# Patient Record
Sex: Female | Born: 1955 | Race: Black or African American | Hispanic: No | Marital: Single | Smoking: Former smoker
Health system: Southern US, Community
[De-identification: ages and names within clinical notes are randomized; demographics above are authoritative.]

## PROBLEM LIST (undated history)

## (undated) DIAGNOSIS — M359 Systemic involvement of connective tissue, unspecified: Secondary | ICD-10-CM

## (undated) DIAGNOSIS — M2550 Pain in unspecified joint: Secondary | ICD-10-CM

## (undated) DIAGNOSIS — D649 Anemia, unspecified: Secondary | ICD-10-CM

## (undated) HISTORY — DX: Anemia, unspecified: D64.9

## (undated) HISTORY — DX: Systemic involvement of connective tissue, unspecified: M35.9

## (undated) HISTORY — DX: Pain in unspecified joint: M25.50

## (undated) HISTORY — PX: COLONOSCOPY: SHX174

---

## 2014-09-22 ENCOUNTER — Ambulatory Visit: Payer: Self-pay | Admitting: Family Medicine

## 2015-11-05 ENCOUNTER — Inpatient Hospital Stay: Payer: BC Managed Care – PPO | Admitting: Oncology

## 2015-12-13 ENCOUNTER — Emergency Department
Admission: EM | Admit: 2015-12-13 | Discharge: 2015-12-13 | Disposition: A | Discharge status: Home / Self Care | Payer: BC Managed Care – PPO | Attending: Student | Admitting: Student

## 2015-12-13 ENCOUNTER — Encounter: Payer: Self-pay | Admitting: Emergency Medicine

## 2015-12-13 DIAGNOSIS — Z87891 Personal history of nicotine dependence: Secondary | ICD-10-CM | POA: Insufficient documentation

## 2015-12-13 DIAGNOSIS — M25562 Pain in left knee: Secondary | ICD-10-CM | POA: Insufficient documentation

## 2015-12-13 DIAGNOSIS — M791 Myalgia, unspecified site: Secondary | ICD-10-CM

## 2015-12-13 DIAGNOSIS — M25561 Pain in right knee: Secondary | ICD-10-CM | POA: Insufficient documentation

## 2015-12-13 DIAGNOSIS — M25551 Pain in right hip: Secondary | ICD-10-CM | POA: Insufficient documentation

## 2015-12-13 DIAGNOSIS — M25552 Pain in left hip: Secondary | ICD-10-CM | POA: Diagnosis not present

## 2015-12-13 DIAGNOSIS — M549 Dorsalgia, unspecified: Secondary | ICD-10-CM | POA: Diagnosis present

## 2015-12-13 DIAGNOSIS — G8929 Other chronic pain: Secondary | ICD-10-CM | POA: Insufficient documentation

## 2015-12-13 DIAGNOSIS — M25569 Pain in unspecified knee: Secondary | ICD-10-CM

## 2015-12-13 DIAGNOSIS — M25559 Pain in unspecified hip: Secondary | ICD-10-CM

## 2015-12-13 LAB — CBC WITH DIFFERENTIAL/PLATELET
BASOS ABS: 0 10*3/uL (ref 0–0.1)
BASOS PCT: 0 %
Eosinophils Absolute: 0.1 10*3/uL (ref 0–0.7)
Eosinophils Relative: 3 %
HEMATOCRIT: 30.9 % — AB (ref 35.0–47.0)
HEMOGLOBIN: 9.9 g/dL — AB (ref 12.0–16.0)
LYMPHS PCT: 19 %
Lymphs Abs: 0.8 10*3/uL — ABNORMAL LOW (ref 1.0–3.6)
MCH: 24.4 pg — ABNORMAL LOW (ref 26.0–34.0)
MCHC: 31.9 g/dL — ABNORMAL LOW (ref 32.0–36.0)
MCV: 76.3 fL — AB (ref 80.0–100.0)
Monocytes Absolute: 0.1 10*3/uL — ABNORMAL LOW (ref 0.2–0.9)
Monocytes Relative: 4 %
NEUTROS ABS: 2.9 10*3/uL (ref 1.4–6.5)
NEUTROS PCT: 74 %
Platelets: 181 10*3/uL (ref 150–440)
RBC: 4.05 MIL/uL (ref 3.80–5.20)
RDW: 16.2 % — ABNORMAL HIGH (ref 11.5–14.5)
WBC: 4 10*3/uL (ref 3.6–11.0)

## 2015-12-13 LAB — BASIC METABOLIC PANEL
ANION GAP: 7 (ref 5–15)
BUN: 6 mg/dL (ref 6–20)
CALCIUM: 9.1 mg/dL (ref 8.9–10.3)
CHLORIDE: 104 mmol/L (ref 101–111)
CO2: 26 mmol/L (ref 22–32)
Creatinine, Ser: 0.61 mg/dL (ref 0.44–1.00)
GFR calc non Af Amer: >60 mL/min (ref >60)
GLUCOSE: 98 mg/dL (ref 65–99)
POTASSIUM: 3.7 mmol/L (ref 3.5–5.1)
Sodium: 137 mmol/L (ref 135–145)

## 2015-12-13 LAB — CK: Total CK: 32 U/L — ABNORMAL LOW (ref 38–234)

## 2015-12-13 MED ORDER — SODIUM CHLORIDE 0.9 % IV BOLUS (SEPSIS)
500.0000 mL | Freq: Once | INTRAVENOUS | Status: AC
  Administered 2015-12-13: 500 mL via INTRAVENOUS

## 2015-12-13 MED ORDER — ONDANSETRON HCL 4 MG/2ML IJ SOLN
4.0000 mg | Freq: Once | INTRAMUSCULAR | Status: AC
  Administered 2015-12-13: 4 mg via INTRAVENOUS
  Filled 2015-12-13: qty 2

## 2015-12-13 MED ORDER — MORPHINE SULFATE (PF) 4 MG/ML IV SOLN
4.0000 mg | Freq: Once | INTRAVENOUS | Status: AC
  Administered 2015-12-13: 4 mg via INTRAVENOUS
  Filled 2015-12-13: qty 1

## 2015-12-13 MED ORDER — KETOROLAC TROMETHAMINE 30 MG/ML IJ SOLN
15.0000 mg | Freq: Once | INTRAMUSCULAR | Status: AC
  Administered 2015-12-13: 15 mg via INTRAVENOUS
  Filled 2015-12-13: qty 1

## 2015-12-13 MED ORDER — TRAMADOL HCL 50 MG PO TABS: 50.0000 mg | ORAL_TABLET | Freq: Three times a day (TID) | ORAL | Status: AC | PRN

## 2015-12-13 NOTE — ED Provider Notes (Signed)
[facility] Emergency Department Provider Note   ____________________________________________  Time seen: Approximately 7:04 PM  I have reviewed the triage vital signs and the nursing notes.   HISTORY  Chief Complaint Generalized Body Aches    HPI Morrison Masser is a 60 y.o. female with history of connective tissue disease, diffuse myalgias, chronic musculoskeletal pain since 2013 who presents for evaluation of severe arthralgias and burning myalgias over the past several months, worse over the past 4 days, gradual onset, constant since onset, worse since she started gabapentin 4 days ago. The patient reports that the pain in her arms, hands, wrists, legs, feet was so severe that she went to see her rheumatologist 4 days ago. They started her on gabapentin which instructions to increase the medication over time. She reports that since she started the medicine she has felt worse. No chest pain or difficulty breathing, no vomiting, diarrhea, fevers or chills. She is also complaining of new lumbar back pain today without trauma, no fevers, no history of IV drug use, no bowel or bladder incontinence, no numbness or weakness in the legs.   History reviewed. No pertinent past medical history.  There are no active problems to display for this patient.   History reviewed. No pertinent past surgical history.  No current outpatient prescriptions on file.  Allergies Review of patient's allergies indicates no known allergies.  History reviewed. No pertinent family history.  Social History Social History  Substance Use Topics  . Smoking status: Former Games developer  . Smokeless tobacco: None  . Alcohol Use: No    Review of Systems Constitutional: No fever/chills Eyes: No visual changes. ENT: No sore throat. Cardiovascular: Denies chest pain. Respiratory: Denies shortness of breath. Gastrointestinal: No abdominal pain.  No nausea, no vomiting.  No diarrhea.   No constipation. Genitourinary: Negative for dysuria. Musculoskeletal: Positive for back pain. Skin: Negative for rash. Neurological: Negative for headaches, focal weakness or numbness.  10-point ROS otherwise negative.  ____________________________________________   PHYSICAL EXAM:  VITAL SIGNS: ED Triage Vitals  Enc Vitals Group     BP 12/13/15 1838 122/81 mmHg     Pulse Rate 12/13/15 1838 97     Resp 12/13/15 1838 20     Temp 12/13/15 1838 98.2 F (36.8 C)     Temp Source 12/13/15 1838 Oral     SpO2 12/13/15 1838 100 %     Weight 12/13/15 1838 134 lb (60.782 kg)     Height 12/13/15 1838 5\' 4"  (1.626 m)     Head Cir --      Peak Flow --      Pain Score 12/13/15 1838 10     Pain Loc --      Pain Edu? --      Excl. in GC? --     Constitutional: Alert and oriented. Well appearing and in no acute distress. Eyes: Conjunctivae are normal. PERRL. EOMI. Head: Atraumatic. Nose: No congestion/rhinnorhea. Mouth/Throat: Mucous membranes are moist.  Oropharynx non-erythematous. Neck: No stridor. Supple without meningismus. Cardiovascular: Normal rate, regular rhythm. Grossly normal heart sounds.  Good peripheral circulation. Respiratory: Normal respiratory effort.  No retractions. Lungs CTAB. Gastrointestinal: Soft and nontender. No distention.  No CVA tenderness. Genitourinary: deferred Musculoskeletal: There is tenderness throughout the forearm, the upper arm, the wrists bilaterally, the feet without obvious swelling or deformity. Mild tenderness throughout the paraspinal muscles associated with the lumbar spine but no midline tenderness. She has pain with range of motion at all the  large and medium joints including the ankles, wrists, knees, hips, elbows. Neurologic:  Normal speech and language. No gross focal neurologic deficits are appreciated. Skin:  Skin is warm, dry and intact. No rash noted. Psychiatric: Mood and affect are normal. Speech and behavior are  normal.  ____________________________________________   LABS (all labs ordered are listed, but only abnormal results are displayed)  Labs Reviewed  CBC WITH DIFFERENTIAL/PLATELET - Abnormal; Notable for the following:    Hemoglobin 9.9 (*)    HCT 30.9 (*)    MCV 76.3 (*)    MCH 24.4 (*)    MCHC 31.9 (*)    RDW 16.2 (*)    Lymphs Abs 0.8 (*)    Monocytes Absolute 0.1 (*)    All other components within normal limits  CK - Abnormal; Notable for the following:    Total CK 32 (*)    All other components within normal limits  BASIC METABOLIC PANEL   ____________________________________________  EKG  none ____________________________________________  RADIOLOGY  none ____________________________________________   PROCEDURES  Procedure(s) performed: None  Critical Care performed: No  ____________________________________________   INITIAL IMPRESSION / ASSESSMENT AND PLAN / ED COURSE  Pertinent labs & imaging results that were available during my care of the patient were reviewed by me and considered in my medical decision making (see chart for details).  Annielee Jemmott is a 60 y.o. female with history of connective tissue disease, diffuse myalgias, chronic muscular skeletal pain since 2013 who presents for evaluation of severe arthralgias and myalgias over the past several months, worse over the past 4 days. On exam, she is generally well-appearing and in no acute distress. Her vital signs are stable, she is afebrile. She has tenderness to palpation throughout her entire body and pain with range of motion at all of her joints. She has nonspecific myalgias and arthralgias which have been ongoing for some time. Her CBC is notable for mild anemia, hemoglobin 9.9 however that is anemia is chronic and known. Unremarkable BMP and CK. We will attempt to treat her pain and reassess for disposition.  ----------------------------------------- 9:09 PM on  12/13/2015 ----------------------------------------- The patient received formula grams of IV morphine, as well as 15 mg of Toradol with significant improvement of her pain. She will she feels well, she is now able to ambulate without any assistance. Discussed with her that she needs to continue taking her gabapentin according to her rheumatologist's instructions and follow-up with her doctors as soon as possible. I discussed that I will send her home with a perscription for a total #6 Ultram tablets for help with breakthrough pain. We discussed return precautions, need for close follow-up and she is comfortable with the discharge plan. DC home. ____________________________________________   FINAL CLINICAL IMPRESSION(S) / ED DIAGNOSES  Final diagnoses:  Chronic arthralgias of knees and hips, unspecified laterality  Myalgia      NEW MEDICATIONS STARTED DURING THIS VISIT:  New Prescriptions   No medications on file     Note:  This document was prepared using Dragon voice recognition software and may include unintentional dictation errors.    Gayla Doss, MD 12/13/15 2111

## 2015-12-13 NOTE — ED Notes (Signed)
MD at bedside. 

## 2015-12-13 NOTE — ED Notes (Signed)
Pt presents with body aches x 2 months that have gotten "excruciating" this week. Pt states she is seeing a rheumatologist at [facility] and had an appt on Monday. Pt states that she was given gabapentin prescription on Monday but feels like her pain is worse since she started taking it. Pt has been in contact with provider this week, who told her to give meds time to work. She states she couldn't stand the pain so she came here. Pt alert & oriented with nad noted. She does show difficulty moving extremities due to pain.

## 2015-12-17 ENCOUNTER — Inpatient Hospital Stay: Payer: BC Managed Care – PPO | Attending: Oncology | Admitting: Oncology

## 2015-12-17 ENCOUNTER — Encounter: Payer: Self-pay | Admitting: Oncology

## 2015-12-17 ENCOUNTER — Inpatient Hospital Stay: Payer: BC Managed Care – PPO

## 2015-12-17 VITALS — BP 118/80 | HR 92 | Temp 98.4°F | Resp 14 | Wt 130.5 lb

## 2015-12-17 DIAGNOSIS — M329 Systemic lupus erythematosus, unspecified: Secondary | ICD-10-CM | POA: Insufficient documentation

## 2015-12-17 DIAGNOSIS — Z87891 Personal history of nicotine dependence: Secondary | ICD-10-CM | POA: Insufficient documentation

## 2015-12-17 DIAGNOSIS — M549 Dorsalgia, unspecified: Secondary | ICD-10-CM | POA: Insufficient documentation

## 2015-12-17 DIAGNOSIS — M2550 Pain in unspecified joint: Secondary | ICD-10-CM | POA: Diagnosis not present

## 2015-12-17 DIAGNOSIS — R5383 Other fatigue: Secondary | ICD-10-CM | POA: Diagnosis not present

## 2015-12-17 DIAGNOSIS — Z7952 Long term (current) use of systemic steroids: Secondary | ICD-10-CM | POA: Insufficient documentation

## 2015-12-17 DIAGNOSIS — Z79899 Other long term (current) drug therapy: Secondary | ICD-10-CM | POA: Insufficient documentation

## 2015-12-17 DIAGNOSIS — D649 Anemia, unspecified: Secondary | ICD-10-CM | POA: Diagnosis not present

## 2015-12-17 DIAGNOSIS — R531 Weakness: Secondary | ICD-10-CM | POA: Insufficient documentation

## 2015-12-17 DIAGNOSIS — M791 Myalgia: Secondary | ICD-10-CM | POA: Insufficient documentation

## 2015-12-17 LAB — LACTATE DEHYDROGENASE: LDH: 170 U/L (ref 98–192)

## 2015-12-17 LAB — FOLATE: FOLATE: 15.6 ng/mL (ref >5.9)

## 2015-12-17 LAB — DAT, POLYSPECIFIC AHG (ARMC ONLY): POLYSPECIFIC AHG TEST: NEGATIVE

## 2015-12-17 LAB — SEDIMENTATION RATE: SED RATE: 76 mm/h — AB (ref 0–30)

## 2015-12-17 NOTE — Progress Notes (Signed)
Patient is not sure why she has been referred to this office but she is hoping it is to help find a diagnosis and help with the pain.  The referral was made for abnormal labs.

## 2015-12-18 LAB — HEMOGLOBINOPATHY EVALUATION
HGB A2 QUANT: 2.2 % (ref 0.7–3.1)
HGB A: 97.8 % (ref 94.0–98.0)
Hgb C: 0 %
Hgb F Quant: 0 % (ref 0.0–2.0)
Hgb S Quant: 0 %

## 2015-12-18 LAB — HAPTOGLOBIN: Haptoglobin: 318 mg/dL — ABNORMAL HIGH (ref 34–200)

## 2015-12-18 LAB — ERYTHROPOIETIN: ERYTHROPOIETIN: 23.1 m[IU]/mL — AB (ref 2.6–18.5)

## 2015-12-19 LAB — VITAMIN B12: Vitamin B-12: 509 pg/mL (ref 180–914)

## 2015-12-21 ENCOUNTER — Telehealth: Payer: Self-pay | Admitting: Oncology

## 2015-12-21 NOTE — Telephone Encounter (Signed)
Left patient message regarding results.

## 2015-12-21 NOTE — Telephone Encounter (Signed)
Patient is in a lot of pain and wants you to please call her to discuss this with her. She said her pain is so bad she may go to ER for relief. She had labs drawn 12/17/15 and was expecting a call back to go over results. Please call: [phone].

## 2015-12-21 NOTE — Telephone Encounter (Signed)
Other than anemia, patient's labs are fine.  I do not have a reason for her pain.

## 2015-12-22 NOTE — Progress Notes (Signed)
[place]  [phone] [phone]  ID: Morgan Reid OB: 1955/09/28  MR#: [id number]  YCX#:[id number]  Patient Care Team: Pcp Not In System as PCP - General  CHIEF COMPLAINT:  Chief Complaint  Patient presents with  . New Evaluation    INTERVAL HISTORY: Patient is a 60 year old female patient with history of lupus and complaints of "pain all over" has been referred for further evaluation and persistent anemia. Currently, she is in discomfort secondary to pain all over inculding myalgias and bone pain. Patient states she is significantly weak and fatigued from constantly being in pain. She has a poor appetite. She denies any shortness of breath, cough, or hemoptysis. She denies any nausea, vomiting, constipation, or diarrhea. She has no melena or hematochezia. She has no urinary patient generally feels terrible, but offers no further specific complaints.complaints. She has no neurologic complaints.  REVIEW OF SYSTEMS:   Review of Systems  Constitutional: Positive for malaise/fatigue. Negative for fever and weight loss.  Respiratory: Negative.  Negative for cough and shortness of breath.   Cardiovascular: Negative.  Negative for chest pain.  Gastrointestinal: Negative.  Negative for blood in stool and melena.  Genitourinary: Negative.   Musculoskeletal: Positive for myalgias, back pain, joint pain and neck pain.  Neurological: Positive for weakness.  Psychiatric/Behavioral: Positive for depression.    As per HPI. Otherwise, a complete review of systems is negatve.  PAST MEDICAL HISTORY: Past Medical History  Diagnosis Date  . Connective tissue disease ([place])     a. S/p Methotrexate. b. Plaquenil. c. Prednisone.   . Anemia   . Arthralgia     PAST SURGICAL HISTORY: No past surgical history on file.  FAMILY HISTORY: Reviewed and unchanged. No reported history of malignancy or chronic disease.     ADVANCED DIRECTIVES:    HEALTH  MAINTENANCE: Social History  Substance Use Topics  . Smoking status: Former Research scientist (life sciences)  . Smokeless tobacco: Not on file  . Alcohol Use: No     Colonoscopy:  PAP:  Bone density:  Lipid panel:  No Known Allergies  Current Outpatient Prescriptions  Medication Sig Dispense Refill  . gabapentin (NEURONTIN) 300 MG capsule     . predniSONE (DELTASONE) 5 MG tablet     . traMADol (ULTRAM) 50 MG tablet Take 1 tablet (50 mg total) by mouth every 8 (eight) hours as needed for severe pain. Do not drive while taking this medication. 6 tablet 0   No current facility-administered medications for this visit.    OBJECTIVE: Filed Vitals:   12/17/15 1141  BP: 118/80  Pulse: 92  Temp: 98.4 F (36.9 C)  Resp: 14     Body mass index is 22.39 kg/(m^2).    ECOG FS:0 - Asymptomatic  General: Well-developed, well-nourished, no acute distress. Eyes: Pink conjunctiva, anicteric sclera. HEENT: Normocephalic, moist mucous membranes, clear oropharnyx. Lungs: Clear to auscultation bilaterally. Heart: Regular rate and rhythm. No rubs, murmurs, or gallops. Abdomen: Soft, nontender, nondistended. No organomegaly noted, normoactive bowel sounds. Musculoskeletal: No edema, cyanosis, or clubbing. Neuro: Alert, answering all questions appropriately. Cranial nerves grossly intact. Skin: No rashes or petechiae noted. Psych: Normal affect. Lymphatics: No cervical, calvicular, axillary or inguinal LAD.   LAB RESULTS:  Lab Results  Component Value Date   NA 137 12/13/2015   K 3.7 12/13/2015   CL 104 12/13/2015   CO2 26 12/13/2015   GLUCOSE 98 12/13/2015   BUN 6 12/13/2015   CREATININE 0.61 12/13/2015   CALCIUM 9.1 12/13/2015  GFRNONAA >60 12/13/2015   GFRAA >60 12/13/2015    Lab Results  Component Value Date   WBC 4.0 12/13/2015   NEUTROABS 2.9 12/13/2015   HGB 9.9* 12/13/2015   HCT 30.9* 12/13/2015   MCV 76.3* 12/13/2015   PLT 181 12/13/2015     STUDIES: No results found.  ASSESSMENT:  Anemia of chronic disease.  PLAN:    1. Anemia: Patient has a decreased hemoglobin, but all of her other laboratory work including iron stores, B12, folate, SPEP, hemoglobin electrophoresis, hemolysis labs are all negative or within normal limits. She has an appropriately elevated erythropoietin. Her sedimentation rate is significantly elevated secondary to inflammation which may be causing some mild bone marrow suppression. Her history of lupus may also be contributing. No intervention is needed at this time. Patient does not require bone marrow biopsy. Patient has been instructed to keep her clinic visit as scheduled with rheumatology for further evaluation and determination of the etiology of her pain and inflammation. Return to clinic in 2 weeks for further evaluation and discussion of her laboratory work.  Approximately 45 minutes was spent in discussion of which greater than 50% was consultation.  Patient expressed understanding and was in agreement with this plan. She also understands that She can call clinic at any time with any questions, concerns, or complaints.   No matching staging information was found for the patient.  Lloyd Huger, MD   12/22/2015 7:52 AM

## 2016-01-01 ENCOUNTER — Inpatient Hospital Stay: Payer: BC Managed Care – PPO | Admitting: Oncology

## 2017-06-09 ENCOUNTER — Other Ambulatory Visit: Payer: Self-pay | Admitting: Family Medicine

## 2017-06-09 DIAGNOSIS — R1030 Lower abdominal pain, unspecified: Secondary | ICD-10-CM

## 2017-06-09 DIAGNOSIS — R634 Abnormal weight loss: Secondary | ICD-10-CM

## 2017-06-17 ENCOUNTER — Ambulatory Visit
Admission: RE | Admit: 2017-06-17 | Discharge: 2017-06-17 | Disposition: A | Discharge status: Home / Self Care | Payer: BC Managed Care – PPO | Source: Ambulatory Visit | Attending: Family Medicine | Admitting: Family Medicine

## 2017-06-17 DIAGNOSIS — R1030 Lower abdominal pain, unspecified: Secondary | ICD-10-CM | POA: Insufficient documentation

## 2017-06-17 DIAGNOSIS — R634 Abnormal weight loss: Secondary | ICD-10-CM | POA: Diagnosis not present

## 2017-06-17 DIAGNOSIS — I82890 Acute embolism and thrombosis of other specified veins: Secondary | ICD-10-CM | POA: Insufficient documentation

## 2017-06-17 HISTORY — DX: Systemic involvement of connective tissue, unspecified: M35.9

## 2017-06-17 LAB — POCT I-STAT CREATININE: Creatinine, Ser: 0.7 mg/dL (ref 0.44–1.00)

## 2017-06-17 MED ORDER — IOPAMIDOL (ISOVUE-300) INJECTION 61%
85.0000 mL | Freq: Once | INTRAVENOUS | Status: AC | PRN
  Administered 2017-06-17: 85 mL via INTRAVENOUS

## 2018-02-02 ENCOUNTER — Emergency Department
Admission: EM | Admit: 2018-02-02 | Discharge: 2018-02-03 | Disposition: A | Discharge status: Home / Self Care | Payer: BC Managed Care – PPO | Attending: Student in an Organized Health Care Education/Training Program | Admitting: Student in an Organized Health Care Education/Training Program

## 2018-02-02 ENCOUNTER — Other Ambulatory Visit: Payer: Self-pay

## 2018-02-02 DIAGNOSIS — Z87891 Personal history of nicotine dependence: Secondary | ICD-10-CM | POA: Diagnosis not present

## 2018-02-02 DIAGNOSIS — Z79899 Other long term (current) drug therapy: Secondary | ICD-10-CM | POA: Insufficient documentation

## 2018-02-02 DIAGNOSIS — R109 Unspecified abdominal pain: Secondary | ICD-10-CM | POA: Diagnosis present

## 2018-02-02 DIAGNOSIS — R112 Nausea with vomiting, unspecified: Secondary | ICD-10-CM | POA: Insufficient documentation

## 2018-02-02 DIAGNOSIS — R935 Abnormal findings on diagnostic imaging of other abdominal regions, including retroperitoneum: Secondary | ICD-10-CM

## 2018-02-02 LAB — COMPREHENSIVE METABOLIC PANEL
ALBUMIN: 3.4 g/dL — AB (ref 3.5–5.0)
ALT: 7 U/L (ref 0–44)
ANION GAP: 12 (ref 5–15)
AST: 19 U/L (ref 15–41)
Alkaline Phosphatase: 95 U/L (ref 38–126)
BILIRUBIN TOTAL: 0.8 mg/dL (ref 0.3–1.2)
BUN: 8 mg/dL (ref 8–23)
CALCIUM: 9.3 mg/dL (ref 8.9–10.3)
CO2: 23 mmol/L (ref 22–32)
Chloride: 104 mmol/L (ref 98–111)
Creatinine, Ser: 0.64 mg/dL (ref 0.44–1.00)
GFR calc Af Amer: >60 mL/min (ref >60)
GFR calc non Af Amer: >60 mL/min (ref >60)
GLUCOSE: 120 mg/dL — AB (ref 70–99)
Potassium: 3.1 mmol/L — ABNORMAL LOW (ref 3.5–5.1)
SODIUM: 139 mmol/L (ref 135–145)
Total Protein: 8.6 g/dL — ABNORMAL HIGH (ref 6.5–8.1)

## 2018-02-02 LAB — CBC
HEMATOCRIT: 35.1 % (ref 35.0–47.0)
HEMOGLOBIN: 11.4 g/dL — AB (ref 12.0–16.0)
MCH: 24 pg — AB (ref 26.0–34.0)
MCHC: 32.4 g/dL (ref 32.0–36.0)
MCV: 74 fL — AB (ref 80.0–100.0)
Platelets: 318 10*3/uL (ref 150–440)
RBC: 4.75 MIL/uL (ref 3.80–5.20)
RDW: 14.1 % (ref 11.5–14.5)
WBC: 7.5 10*3/uL (ref 3.6–11.0)

## 2018-02-02 LAB — URINALYSIS, COMPLETE (UACMP) WITH MICROSCOPIC
BACTERIA UA: NONE SEEN
GLUCOSE, UA: NEGATIVE mg/dL
Hgb urine dipstick: NEGATIVE
KETONES UR: 20 mg/dL — AB
Leukocytes, UA: NEGATIVE
Nitrite: NEGATIVE
PROTEIN: NEGATIVE mg/dL
Specific Gravity, Urine: 1.012 (ref 1.005–1.030)
pH: 5 (ref 5.0–8.0)

## 2018-02-02 LAB — LIPASE, BLOOD: Lipase: 21 U/L (ref 11–51)

## 2018-02-02 MED ORDER — SODIUM CHLORIDE 0.9 % IV BOLUS
1000.0000 mL | Freq: Once | INTRAVENOUS | Status: AC
  Administered 2018-02-03: 1000 mL via INTRAVENOUS

## 2018-02-02 MED ORDER — MORPHINE SULFATE (PF) 4 MG/ML IV SOLN
4.0000 mg | INTRAVENOUS | Status: DC | PRN
  Administered 2018-02-03: 4 mg via INTRAVENOUS
  Filled 2018-02-02: qty 1

## 2018-02-02 MED ORDER — PROMETHAZINE HCL 25 MG/ML IJ SOLN
12.5000 mg | Freq: Four times a day (QID) | INTRAMUSCULAR | Status: DC | PRN
Start: 2018-02-02 — End: 2018-02-03
  Administered 2018-02-03: 12.5 mg via INTRAVENOUS
  Filled 2018-02-02 (×2): qty 1

## 2018-02-02 NOTE — ED Provider Notes (Addendum)
[facility] Emergency Department Provider Note    First MD Initiated Contact with Patient 02/02/18 2342     (approximate)  I have reviewed the triage vital signs and the nursing notes.   HISTORY  Chief Complaint Abdominal Pain and Emesis    HPI Elysium Bejar is a 62 y.o. female presents to the ER with chief complaint of left lower quadrant abdominal pain associate with nausea vomiting.  Patient has been taking GoLYTELY for bowel prep for colonoscopy scheduled for tomorrow morning but due to the nausea vomiting has not been able to keep any medications down.  States that she is having worsening abdominal pain and left lower quadrant.  No fevers.  States that she was supposed to have a colonoscopy after having CT imaging previously that was concerning for diverticulitis versus mass but was unable to complete that due to an adequate bowel prep.  Have been scheduled for outpatient colonoscopy tomorrow.  Denies any fevers.  No chest pain or shortness of breath.    Past Medical History:  Diagnosis Date  . Anemia   . Arthralgia   . Collagen vascular disease ([facility])    Rheumatoid Arthritis  . Connective tissue disease ([facility])    a. S/p Methotrexate. b. Plaquenil. c. Prednisone.    No family history on file. History reviewed. No pertinent surgical history. There are no active problems to display for this patient.     Prior to Admission medications   Medication Sig Start Date End Date Taking? Authorizing Provider  gabapentin (NEURONTIN) 300 MG capsule  12/10/15   [provider]  predniSONE (DELTASONE) 5 MG tablet  09/27/15   [provider]  promethazine (PHENERGAN) 12.5 MG tablet Take 1 tablet (12.5 mg total) by mouth every 6 (six) hours as needed for nausea or vomiting. 02/03/18   Willy Eddy, MD  traMADol (ULTRAM) 50 MG tablet Take 1 tablet (50 mg total) by mouth every 6 (six) hours as needed. 02/03/18 02/03/19  Willy Eddy, MD     Allergies Patient has no known allergies.    Social History Social History   Tobacco Use  . Smoking status: Former Games developer  . Smokeless tobacco: Never Used  Substance Use Topics  . Alcohol use: No  . Drug use: Not on file    Review of Systems Patient denies headaches, rhinorrhea, blurry vision, numbness, shortness of breath, chest pain, edema, cough, abdominal pain, nausea, vomiting, diarrhea, dysuria, fevers, rashes or hallucinations unless otherwise stated above in HPI. ____________________________________________   PHYSICAL EXAM:  VITAL SIGNS: Vitals:   02/03/18 0530 02/03/18 0600  BP: 116/68 119/78  Pulse: 84 85  Resp:    Temp:    SpO2: 98% 99%    Constitutional: Alert and oriented.  Eyes: Conjunctivae are normal.  Head: Atraumatic. Nose: No congestion/rhinnorhea. Mouth/Throat: Mucous membranes are moist.   Neck: No stridor. Painless ROM.  Cardiovascular: Normal rate, regular rhythm. Grossly normal heart sounds.  Good peripheral circulation. Respiratory: Normal respiratory effort.  No retractions. Lungs CTAB. Gastrointestinal: Soft with mild ttp in LLQ. No distention. No abdominal bruits. No CVA tenderness. Genitourinary:  Musculoskeletal: No lower extremity tenderness nor edema.  No joint effusions. Neurologic:  Normal speech and language. No gross focal neurologic deficits are appreciated. No facial droop Skin:  Skin is warm, dry and intact. No rash noted. Psychiatric: Mood and affect are normal. Speech and behavior are normal.  ____________________________________________   LABS (all labs ordered are listed, but only abnormal results are displayed)  Results for orders placed or performed during the hospital encounter of 02/02/18 (from the past 24 hour(s))  Lipase, blood     Status: None   Collection Time: 02/02/18  9:10 PM  Result Value Ref Range   Lipase 21 11 - 51 U/L  Comprehensive metabolic panel     Status: Abnormal   Collection Time:  02/02/18  9:10 PM  Result Value Ref Range   Sodium 139 135 - 145 mmol/L   Potassium 3.1 (L) 3.5 - 5.1 mmol/L   Chloride 104 98 - 111 mmol/L   CO2 23 22 - 32 mmol/L   Glucose, Bld 120 (H) 70 - 99 mg/dL   BUN 8 8 - 23 mg/dL   Creatinine, Ser 1.70 0.44 - 1.00 mg/dL   Calcium 9.3 8.9 - 01.7 mg/dL   Total Protein 8.6 (H) 6.5 - 8.1 g/dL   Albumin 3.4 (L) 3.5 - 5.0 g/dL   AST 19 15 - 41 U/L   ALT 7 0 - 44 U/L   Alkaline Phosphatase 95 38 - 126 U/L   Total Bilirubin 0.8 0.3 - 1.2 mg/dL   GFR calc non Af Amer >60 >60 mL/min   GFR calc Af Amer >60 >60 mL/min   Anion gap 12 5 - 15  CBC     Status: Abnormal   Collection Time: 02/02/18  9:10 PM  Result Value Ref Range   WBC 7.5 3.6 - 11.0 K/uL   RBC 4.75 3.80 - 5.20 MIL/uL   Hemoglobin 11.4 (L) 12.0 - 16.0 g/dL   HCT 49.4 49.6 - 75.9 %   MCV 74.0 (L) 80.0 - 100.0 fL   MCH 24.0 (L) 26.0 - 34.0 pg   MCHC 32.4 32.0 - 36.0 g/dL   RDW 16.3 84.6 - 65.9 %   Platelets 318 150 - 440 K/uL  Urinalysis, Complete w Microscopic     Status: Abnormal   Collection Time: 02/02/18  9:10 PM  Result Value Ref Range   Color, Urine AMBER (A) YELLOW   APPearance CLEAR (A) CLEAR   Specific Gravity, Urine 1.012 1.005 - 1.030   pH 5.0 5.0 - 8.0   Glucose, UA NEGATIVE NEGATIVE mg/dL   Hgb urine dipstick NEGATIVE NEGATIVE   Bilirubin Urine SMALL (A) NEGATIVE   Ketones, ur 20 (A) NEGATIVE mg/dL   Protein, ur NEGATIVE NEGATIVE mg/dL   Nitrite NEGATIVE NEGATIVE   Leukocytes, UA NEGATIVE NEGATIVE   RBC / HPF 6-10 0 - 5 RBC/hpf   WBC, UA 0-5 0 - 5 WBC/hpf   Bacteria, UA NONE SEEN NONE SEEN   Squamous Epithelial / LPF 6-10 0 - 5   Mucus PRESENT    ____________________________________________ ____________________________________________  RADIOLOGY  I personally reviewed all radiographic images ordered to evaluate for the above acute complaints and reviewed radiology reports and findings.  These findings were personally discussed with the patient.  Please see  medical record for radiology report.  ____________________________________________   PROCEDURES  Procedure(s) performed:  Procedures    Critical Care performed: no ____________________________________________   INITIAL IMPRESSION / ASSESSMENT AND PLAN / ED COURSE  Pertinent labs & imaging results that were available during my care of the patient were reviewed by me and considered in my medical decision making (see chart for details).   DDX: sbo, enteritis, diverticulitis, medication reaction, dehydration  Zhania Shaheen is a 62 y.o. who presents to the ED with symptoms as described above.  Patient afebrile hemodynamically stable.  Nontoxic-appearing but does have some discomfort  in her abdomen.  Do suspect some component of intolerance of the bowel prep.  Will initially trial somatic management.  Order KUB to evaluate for obstructive pattern.  Clinical Course as of Feb 04 647  Wed Feb 03, 2018  2778 Patient reassessed.  Resting comfortably.  Repeat abdominal exam is benign.  Seems less consistent with infectious process given lack of tachycardia leukocytosis or fever.  Probable and issues with not tolerating bowel prep.  KUB shows no evidence of obstructive pattern.  Will trial p.o. challenge and reassess.   [PR]  0217 With persistent discomfort.  States that she is not passing any gas therefore may have some worsening obstructive pathology that may be preventing her from tolerating appropriate bowel prep and further evaluate will order CT imaging.   [PR]  0438 CT imaging results discussed with patient and family.  Patient still having some discomfort.  At this point there is no evidence of complete obstruction.  Plan will be to touch base with GI earlier this morning as she is scheduled for early morning colonoscopy.  We will keep patient on IV fluids.   [PR]  0630 Patient reassessed.  No nausea or vomiting.  May have had some intolerance or inability to tolerate a bowel prep  causing primary nausea and vomiting.  Spoke with Dr. Marva Panda who recommends patient come to clinic today.  Her pain is under control at this time she does not have any additional vomiting.  Have discussed with the patient and available family all diagnostics and treatments performed thus far and all questions were answered to the best of my ability. The patient demonstrates understanding and agreement with plan.    [PR]    Clinical Course User Index [PR] Willy Eddy, MD     As part of my medical decision making, I reviewed the following data within the electronic MEDICAL RECORD NUMBER Nursing notes reviewed and incorporated, Labs reviewed, notes from prior ED visits.  ____________________________________________   FINAL CLINICAL IMPRESSION(S) / ED DIAGNOSES  Final diagnoses:  Non-intractable vomiting with nausea, unspecified vomiting type  Abnormal CT of the abdomen      NEW MEDICATIONS STARTED DURING THIS VISIT:  New Prescriptions   PROMETHAZINE (PHENERGAN) 12.5 MG TABLET    Take 1 tablet (12.5 mg total) by mouth every 6 (six) hours as needed for nausea or vomiting.   TRAMADOL (ULTRAM) 50 MG TABLET    Take 1 tablet (50 mg total) by mouth every 6 (six) hours as needed.     Note:  This document was prepared using Dragon voice recognition software and may include unintentional dictation errors.    Willy Eddy, MD 02/03/18 2423    Willy Eddy, MD 02/03/18 [phone]

## 2018-02-02 NOTE — ED Triage Notes (Addendum)
Pt arrives to ED via POV from home with c/o abdominal pain and emesis x "months". Pt has a coloscopy scheduled for tomorrow and states the bowel prep might be responsible for her s/x's. Pt reports LLQ abdominal pain, states she can "feel it when I walk". Pt denies CP or SHOB. Pt is A&O, in NAD; RR even, regular, and unlabored.

## 2018-02-03 ENCOUNTER — Emergency Department: Payer: BC Managed Care – PPO

## 2018-02-03 MED ORDER — POLYETHYLENE GLYCOL 3350 17 G PO PACK
17.0000 g | PACK | Freq: Every day | ORAL | Status: DC
  Administered 2018-02-03: 17 g via ORAL

## 2018-02-03 MED ORDER — POLYETHYLENE GLYCOL 3350 17 G PO PACK
PACK | ORAL | Status: AC
  Administered 2018-02-03: 17 g via ORAL
  Filled 2018-02-03: qty 1

## 2018-02-03 MED ORDER — TRAMADOL HCL 50 MG PO TABS: 50.0000 mg | ORAL_TABLET | Freq: Four times a day (QID) | ORAL | 0 refills | Status: AC | PRN

## 2018-02-03 MED ORDER — FENTANYL CITRATE (PF) 100 MCG/2ML IJ SOLN
100.0000 ug | INTRAMUSCULAR | Status: DC | PRN
  Administered 2018-02-03 (×2): 100 ug via INTRAVENOUS
  Filled 2018-02-03 (×2): qty 2

## 2018-02-03 MED ORDER — PROMETHAZINE HCL 12.5 MG PO TABS: 12.5000 mg | ORAL_TABLET | Freq: Four times a day (QID) | ORAL | 0 refills | Status: AC | PRN

## 2018-02-03 MED ORDER — SODIUM CHLORIDE 0.9 % IV BOLUS
1000.0000 mL | Freq: Once | INTRAVENOUS | Status: AC
  Administered 2018-02-03: 1000 mL via INTRAVENOUS

## 2018-02-03 MED ORDER — KETOROLAC TROMETHAMINE 30 MG/ML IJ SOLN
15.0000 mg | Freq: Once | INTRAMUSCULAR | Status: AC
  Administered 2018-02-03: 15 mg via INTRAVENOUS
  Filled 2018-02-03: qty 1

## 2018-02-03 NOTE — ED Notes (Signed)
Pt up to bedside toilet. Pt states she feels like she needs to have a bowel movement. Assisted with ambulation.

## 2018-02-03 NOTE — ED Notes (Signed)
Pt to CT. States she is feeling less nauseated since receiving pain medication.

## 2018-02-03 NOTE — ED Notes (Signed)
Pt unable to produce a bowel movement. Voided without difficulty.

## 2018-02-03 NOTE — Discharge Instructions (Addendum)
Do not go to colonoscopy today but instead go to Dr. Reyes Ivan clinic this afternoon.  I will prescribe you some pain medication as well as nausea medication.  You have been seen in the emergency department for emergency care. It is important that you contact your own doctor, specialist or the closest clinic for follow-up care. Please bring this instruction sheet, all medications and X-ray copies with you when you are seen for follow-up care.  Determining the exact cause for all patients with abdominal pain is extremely difficult in the emergency department. Our primary focus is to rule-out immediate life-threatening diseases. If no immediate source of pain is found the definitive diagnosis frequently needs to be determined over time.Many times your primary care physician can determine the cause by following the symptoms over time. Sometimes, specialist are required such as Gastroenterologists, Gynecologists, Urologists or Surgeons. Please return immediately to the Emergency Department for fever>101, Vomiting or Intractable Pain. You should return to the emergency department or see your primary care provider in 12-24hrs if your pain is no better and sooner if your pain becomes worse.

## 2018-03-25 ENCOUNTER — Ambulatory Visit: Payer: BC Managed Care – PPO | Admitting: Anesthesiology

## 2018-03-25 ENCOUNTER — Ambulatory Visit: Payer: BC Managed Care – PPO

## 2018-03-25 ENCOUNTER — Inpatient Hospital Stay
Admission: AD | Admit: 2018-03-25 | Discharge: 2018-04-14 | DRG: 329 | Disposition: A | Discharge status: Home / Self Care | Payer: BC Managed Care – PPO | Attending: General Surgery | Admitting: General Surgery

## 2018-03-25 ENCOUNTER — Other Ambulatory Visit: Payer: Self-pay

## 2018-03-25 ENCOUNTER — Encounter
Admission: AD | Disposition: A | Discharge status: Home / Self Care | Payer: Self-pay | Source: Home / Self Care | Attending: General Surgery

## 2018-03-25 ENCOUNTER — Encounter: Payer: Self-pay | Admitting: *Deleted

## 2018-03-25 DIAGNOSIS — Z681 Body mass index (BMI) 19 or less, adult: Secondary | ICD-10-CM

## 2018-03-25 DIAGNOSIS — K449 Diaphragmatic hernia without obstruction or gangrene: Secondary | ICD-10-CM | POA: Diagnosis present

## 2018-03-25 DIAGNOSIS — K2960 Other gastritis without bleeding: Secondary | ICD-10-CM | POA: Diagnosis present

## 2018-03-25 DIAGNOSIS — M069 Rheumatoid arthritis, unspecified: Secondary | ICD-10-CM | POA: Diagnosis present

## 2018-03-25 DIAGNOSIS — Z87891 Personal history of nicotine dependence: Secondary | ICD-10-CM

## 2018-03-25 DIAGNOSIS — K6389 Other specified diseases of intestine: Secondary | ICD-10-CM | POA: Diagnosis present

## 2018-03-25 DIAGNOSIS — D509 Iron deficiency anemia, unspecified: Secondary | ICD-10-CM | POA: Diagnosis present

## 2018-03-25 DIAGNOSIS — L0390 Cellulitis, unspecified: Secondary | ICD-10-CM | POA: Diagnosis present

## 2018-03-25 DIAGNOSIS — R109 Unspecified abdominal pain: Secondary | ICD-10-CM

## 2018-03-25 DIAGNOSIS — K2210 Ulcer of esophagus without bleeding: Secondary | ICD-10-CM | POA: Diagnosis present

## 2018-03-25 DIAGNOSIS — D638 Anemia in other chronic diseases classified elsewhere: Secondary | ICD-10-CM | POA: Diagnosis present

## 2018-03-25 DIAGNOSIS — E43 Unspecified severe protein-calorie malnutrition: Secondary | ICD-10-CM

## 2018-03-25 DIAGNOSIS — K5669 Other partial intestinal obstruction: Principal | ICD-10-CM | POA: Diagnosis present

## 2018-03-25 DIAGNOSIS — E876 Hypokalemia: Secondary | ICD-10-CM | POA: Diagnosis present

## 2018-03-25 DIAGNOSIS — G8929 Other chronic pain: Secondary | ICD-10-CM | POA: Diagnosis present

## 2018-03-25 DIAGNOSIS — Z7952 Long term (current) use of systemic steroids: Secondary | ICD-10-CM

## 2018-03-25 HISTORY — PX: COLONOSCOPY WITH PROPOFOL: SHX5780

## 2018-03-25 HISTORY — PX: ESOPHAGOGASTRODUODENOSCOPY (EGD) WITH PROPOFOL: SHX5813

## 2018-03-25 LAB — BASIC METABOLIC PANEL
ANION GAP: 8 (ref 5–15)
BUN: 9 mg/dL (ref 8–23)
CALCIUM: 8.4 mg/dL — AB (ref 8.9–10.3)
CO2: 26 mmol/L (ref 22–32)
Chloride: 106 mmol/L (ref 98–111)
Creatinine, Ser: 0.5 mg/dL (ref 0.44–1.00)
Glucose, Bld: 67 mg/dL — ABNORMAL LOW (ref 70–99)
POTASSIUM: 3.3 mmol/L — AB (ref 3.5–5.1)
Sodium: 140 mmol/L (ref 135–145)

## 2018-03-25 LAB — CBC
HCT: 27.8 % — ABNORMAL LOW (ref 35.0–47.0)
Hemoglobin: 9 g/dL — ABNORMAL LOW (ref 12.0–16.0)
MCH: 24.2 pg — ABNORMAL LOW (ref 26.0–34.0)
MCHC: 32.3 g/dL (ref 32.0–36.0)
MCV: 75 fL — AB (ref 80.0–100.0)
PLATELETS: 236 10*3/uL (ref 150–440)
RBC: 3.7 MIL/uL — ABNORMAL LOW (ref 3.80–5.20)
RDW: 16.4 % — AB (ref 11.5–14.5)
WBC: 3.7 10*3/uL (ref 3.6–11.0)

## 2018-03-25 SURGERY — COLONOSCOPY WITH PROPOFOL
Anesthesia: General

## 2018-03-25 MED ORDER — KETOROLAC TROMETHAMINE 15 MG/ML IJ SOLN
15.0000 mg | Freq: Four times a day (QID) | INTRAMUSCULAR | Status: DC | PRN
  Administered 2018-03-25 – 2018-03-26 (×3): 15 mg via INTRAVENOUS
  Filled 2018-03-25 (×4): qty 1

## 2018-03-25 MED ORDER — MIDAZOLAM HCL 2 MG/2ML IJ SOLN
INTRAMUSCULAR | Status: AC
  Filled 2018-03-25: qty 2

## 2018-03-25 MED ORDER — LIDOCAINE HCL (PF) 2 % IJ SOLN
INTRAMUSCULAR | Status: AC
  Filled 2018-03-25: qty 10

## 2018-03-25 MED ORDER — ONDANSETRON HCL 4 MG PO TABS: 4.0000 mg | ORAL_TABLET | Freq: Four times a day (QID) | ORAL | Status: DC | PRN

## 2018-03-25 MED ORDER — HYDROXYCHLOROQUINE SULFATE 200 MG PO TABS
200.0000 mg | ORAL_TABLET | ORAL | Status: DC
  Administered 2018-03-25: 200 mg via ORAL
  Filled 2018-03-25 (×11): qty 1

## 2018-03-25 MED ORDER — PROPOFOL 500 MG/50ML IV EMUL
INTRAVENOUS | Status: AC
  Filled 2018-03-25: qty 50

## 2018-03-25 MED ORDER — PROPOFOL 500 MG/50ML IV EMUL
INTRAVENOUS | Status: DC | PRN
  Administered 2018-03-25: 130 ug/kg/min via INTRAVENOUS

## 2018-03-25 MED ORDER — SODIUM CHLORIDE 0.9 % IV SOLN
INTRAVENOUS | Status: DC
  Administered 2018-03-25: 11:00:00 via INTRAVENOUS

## 2018-03-25 MED ORDER — PEG 3350-KCL-NA BICARB-NACL 420 G PO SOLR
4000.0000 mL | ORAL | Status: DC
  Administered 2018-03-25 – 2018-03-26 (×2): 4000 mL via ORAL
  Filled 2018-03-25: qty 4000

## 2018-03-25 MED ORDER — PHENYLEPHRINE HCL 10 MG/ML IJ SOLN
INTRAMUSCULAR | Status: DC | PRN
  Administered 2018-03-25: 50 ug via INTRAVENOUS

## 2018-03-25 MED ORDER — ONDANSETRON HCL 4 MG/2ML IJ SOLN
4.0000 mg | Freq: Four times a day (QID) | INTRAMUSCULAR | Status: DC | PRN
  Administered 2018-03-25 – 2018-03-26 (×2): 4 mg via INTRAVENOUS
  Filled 2018-03-25 (×2): qty 2

## 2018-03-25 MED ORDER — ACETAMINOPHEN 650 MG RE SUPP
650.0000 mg | Freq: Four times a day (QID) | RECTAL | Status: DC | PRN
  Filled 2018-03-25: qty 1

## 2018-03-25 MED ORDER — LIDOCAINE HCL (CARDIAC) PF 100 MG/5ML IV SOSY
PREFILLED_SYRINGE | INTRAVENOUS | Status: DC | PRN
  Administered 2018-03-25: 50 mg via INTRAVENOUS

## 2018-03-25 MED ORDER — ACETAMINOPHEN 325 MG PO TABS
650.0000 mg | ORAL_TABLET | Freq: Four times a day (QID) | ORAL | Status: DC | PRN
  Administered 2018-03-26 – 2018-04-08 (×3): 650 mg via ORAL
  Filled 2018-03-25 (×3): qty 2

## 2018-03-25 MED ORDER — PROPOFOL 10 MG/ML IV BOLUS
INTRAVENOUS | Status: DC | PRN
  Administered 2018-03-25: 20 mg via INTRAVENOUS
  Administered 2018-03-25: 50 mg via INTRAVENOUS
  Administered 2018-03-25: 20 mg via INTRAVENOUS

## 2018-03-25 MED ORDER — METHYLPREDNISOLONE 4 MG PO TABS
4.0000 mg | ORAL_TABLET | Freq: Every day | ORAL | Status: DC
  Administered 2018-04-01 – 2018-04-04 (×4): 4 mg via ORAL
  Filled 2018-03-25 (×13): qty 1

## 2018-03-25 NOTE — Op Note (Signed)
[facility] Patient Name: Morgan Reid Procedure Date: 03/25/2018 12:57 PM MRN: [id number] Account #: [id number] Date of Birth: May 02, 1956 Admit Type: Outpatient Age: 62 Room: [facility] ENDO ROOM 2 Gender: Female Note Status: Finalized Procedure:            Upper GI endoscopy Indications:          Epigastric abdominal pain, Nausea with vomiting Providers:            [name], MD Referring MD:         No Local Md, MD (Referring MD) Medicines:            Monitored Anesthesia Care Complications:        No immediate complications. Procedure:            Pre-Anesthesia Assessment:                       - ASA Grade Assessment: III - A patient with severe                        systemic disease.                       After obtaining informed consent, the endoscope was                        passed under direct vision. Throughout the procedure,                        the patient's blood pressure, pulse, and oxygen                        saturations were monitored continuously. The Endoscope                        was introduced through the mouth, and advanced to the                        third part of duodenum. The upper GI endoscopy was                        accomplished without difficulty. The patient tolerated                        the procedure well. Findings:      A small hiatal hernia was present.      Patchy mild inflammation characterized by adherent blood, congestion       (edema) and erosions was found in the gastric body. Biopsies were taken       with a cold forceps for histology. Biopsies were taken with a cold       forceps for Helicobacter pylori testing.      The cardia and gastric fundus were normal on retroflexion otherwise.      The examined duodenum was normal. Impression:           - Small hiatal hernia.                       - Bile erosive gastritis. Biopsied.                       - Normal examined duodenum.  Recommendation:        - Use Protonix (pantoprazole) 40 mg PO daily daily.                       - Await pathology results. Procedure Code(s):    --- Professional ---                       [id number], Esophagogastroduodenoscopy, flexible, transoral;                        with biopsy, single or multiple Diagnosis Code(s):    --- Professional ---                       K44.9, Diaphragmatic hernia without obstruction or                        gangrene                       K29.60, Other gastritis without bleeding                       R10.13, Epigastric pain                       R11.2, Nausea with vomiting, unspecified CPT copyright 2017 American Medical Association. All rights reserved. The codes documented in this report are preliminary and upon coder review may  be revised to meet current compliance requirements. [name], MD 03/25/2018 1:17:06 PM This report has been signed electronically. Number of Addenda: 0 Note Initiated On: 03/25/2018 12:57 PM      [facility]

## 2018-03-25 NOTE — Transfer of Care (Signed)
Immediate Anesthesia Transfer of Care Note  Patient: Morgan Reid  Procedure(s) Performed: COLONOSCOPY WITH PROPOFOL (N/A ) ESOPHAGOGASTRODUODENOSCOPY (EGD) WITH PROPOFOL (N/A )  Patient Location: PACU  Anesthesia Type:General  Level of Consciousness: sedated and responds to stimulation  Airway & Oxygen Therapy: Patient Spontanous Breathing and Patient connected to nasal cannula oxygen  Post-op Assessment: Report given to RN and Post -op Vital signs reviewed and stable  Post vital signs: Reviewed and stable  Last Vitals:  Vitals Value Taken Time  BP 91/55 03/25/2018  1:29 PM  Temp    Pulse    Resp 13 03/25/2018  1:29 PM  SpO2 99 % 03/25/2018  1:29 PM    Last Pain:  Vitals:   03/25/18 1325  TempSrc: (P) Tympanic         Complications: No apparent anesthesia complications

## 2018-03-25 NOTE — H&P (Addendum)
Outpatient short stay form Pre-procedure 03/25/2018 11:18 AM [name] MD  Primary Physician: Wyn Forster, MD  Reason for visit: EGD and colonoscopy  History of present illness: Patient is a 62 year old female presenting today as above.  She has a history of abnormal CT scan of the abdomen, this was read several months ago is being possible diverticulitis.  She was arranged for a colonoscopy several months ago however her prep was poor and incomplete and could not be completed at that time.  Subsequently she has difficulty with getting a prep done adequately.  She did return back to the office again yesterday.  He has had several ER visits has not been admitted to the hospital.  Most recently a CT scan was done on 02/03/2018.  Is more so being interpreted as possible colonic malignancy.  There is a possible partial suction due to this.  Has had some intermittent nausea and vomiting, weight loss    Current Facility-Administered Medications:  .  0.9 %  sodium chloride infusion, , Intravenous, Continuous, [name], MD  Medications Prior to Admission  Medication Sig Dispense Refill Last Dose  . dicyclomine (BENTYL) 20 MG tablet Take 20 mg by mouth every 6 (six) hours.     . hydroxychloroquine (PLAQUENIL) 200 MG tablet Take 200 mg by mouth every other day.   Past Week at Unknown time  . meloxicam (MOBIC) 7.5 MG tablet Take 7.5 mg by mouth daily as needed for pain.   Past Week at Unknown time  . methylPREDNISolone (MEDROL) 4 MG tablet Take 4 mg by mouth daily.   Past Week at Unknown time  . Vitamin D, Ergocalciferol, (DRISDOL) 50000 units CAPS capsule Take 50,000 Units by mouth every 7 (seven) days.     Marland Kitchen gabapentin (NEURONTIN) 300 MG capsule    Taking  . predniSONE (DELTASONE) 5 MG tablet    Taking  . promethazine (PHENERGAN) 12.5 MG tablet Take 1 tablet (12.5 mg total) by mouth every 6 (six) hours as needed for nausea or vomiting. 12 tablet 0   . traMADol (ULTRAM) 50 MG tablet  Take 1 tablet (50 mg total) by mouth every 6 (six) hours as needed. (Patient not taking: Reported on 03/25/2018) 10 tablet 0 Not Taking at Unknown time     No Known Allergies   Past Medical History:  Diagnosis Date  . Anemia   . Arthralgia   . Arthralgia   . Collagen vascular disease (HCC)    Rheumatoid Arthritis  . Connective tissue disease (HCC)    a. S/p Methotrexate. b. Plaquenil. c. Prednisone.   . Connective tissue disease (HCC)    S/P a.Methotrexate, b. Plaquenil c. Prednisone    Review of systems:      Physical Exam    Heart and lungs: Good rate and rhythm without rub or gallop, mild tachycardia.  Laterally clear to auscultation    HEENT: Cephalic atraumatic eyes are anicteric    Other:     Pertinant exam for procedure: A mild distention of the abdomen.  Bowel sounds are positive normoactive.  There is a mass/mass-effect in the left lower quadrant.  No rebound.  She is tender generalized.    Planned proceedures: EGD and colonoscopy with indicated procedures. I have discussed the risks benefits and complications of procedures to include not limited to bleeding, infection, perforation and the risk of sedation and the patient wishes to proceed.    [name], MD Gastroenterology 03/25/2018  11:18 AM  Addendum acute abdomen  series showed evidence of dilated bowel loops or free intraperitoneal peritoneal air.  There is a moderate amount of stool seen throughout the colon.  No evidence of bowel obstruction or ileus acute cardiopulmonary disease.  We will proceed with EGD and colonoscopy.

## 2018-03-25 NOTE — H&P (Signed)
[facility] [place] [facility]   PATIENT NAME: Morgan Reid    MR#:  [id number]  DATE OF BIRTH:  04-Feb-1956  DATE OF ADMISSION:  03/25/2018  PRIMARY CARE PHYSICIAN: System, Pcp Not In   REQUESTING/REFERRING PHYSICIAN: Dr. Barnetta Chapel  CHIEF COMPLAINT:  No chief complaint on file.   HISTORY OF PRESENT ILLNESS:  Morgan Reid  is a 62 y.o. female with a known history of collagen vascular disease, rheumatoid arthritis, chronic anemia who presents to the hospital from the endoscopy suite due to inability to finish her colonoscopy prep and inability to do the colonoscopy.  Patient apparently has had significant weight loss about 20 to 30 pounds over the past few months and had a CT scan of the abdomen pelvis which is suggestive of a sigmoid mass, and patient was scheduled for endoscopy colonoscopy today.  Patient was able to undergo the endoscopy but could not tolerate the colonoscopy prep at home as she had intractable nausea vomiting.  The gastroenterologist attempted to do her colonoscopy but due to the incomplete prep it could not be done.  The gastroenterologist recommended patient to the hospital and for the patient to get the prep was slowly overnight here in the hospital along with antiemetics so he can do a repeat colonoscopy tomorrow.  Patient is therefore being admitted to the hospital.  Patient presently does complain of some vague abdominal pain which is in the lower part of her abdomen associated with some intermittent nausea but no vomiting.  She admits to poor p.o. intake and has had about a 2030 pound weight loss over the past few months.  PAST MEDICAL HISTORY:   Past Medical History:  Diagnosis Date  . Anemia   . Arthralgia   . Arthralgia   . Collagen vascular disease (HCC)    Rheumatoid Arthritis  . Connective tissue disease (HCC)    a. S/p Methotrexate. b. Plaquenil. c. Prednisone.   . Connective tissue disease (HCC)    S/P a.Methotrexate, b.  Plaquenil c. Prednisone    PAST SURGICAL HISTORY:   Past Surgical History:  Procedure Laterality Date  . COLONOSCOPY     poor prep, rescheduled for 02/03/2018    SOCIAL HISTORY:   Social History   Tobacco Use  . Smoking status: Former Smoker    Packs/day: 0.25    Years: 30.00    Pack years: 7.50    Types: Cigarettes  . Smokeless tobacco: Never Used  Substance Use Topics  . Alcohol use: No    FAMILY HISTORY:   Family History  Problem Relation Age of Onset  . Schizophrenia Sister     DRUG ALLERGIES:  No Known Allergies  REVIEW OF SYSTEMS:   Review of Systems  Constitutional: Positive for weight loss. Negative for fever.  HENT: Negative for congestion, nosebleeds and tinnitus.   Eyes: Negative for blurred vision, double vision and redness.  Respiratory: Negative for cough, hemoptysis and shortness of breath.   Cardiovascular: Negative for chest pain, orthopnea, leg swelling and PND.  Gastrointestinal: Positive for abdominal pain and nausea. Negative for diarrhea, melena and vomiting.  Genitourinary: Negative for dysuria, hematuria and urgency.  Musculoskeletal: Negative for falls and joint pain.  Neurological: Negative for dizziness, tingling, sensory change, focal weakness, seizures, weakness and headaches.  Endo/Heme/Allergies: Negative for polydipsia. Does not bruise/bleed easily.  Psychiatric/Behavioral: Negative for depression and memory loss. The patient is not nervous/anxious.     MEDICATIONS AT HOME:   Prior to Admission medications  Medication Sig Start Date End Date Taking? Authorizing Provider  dicyclomine (BENTYL) 20 MG tablet Take 20 mg by mouth every 6 (six) hours.   Yes [provider]  hydroxychloroquine (PLAQUENIL) 200 MG tablet Take 200 mg by mouth every other day.   Yes [provider]  meloxicam (MOBIC) 7.5 MG tablet Take 7.5 mg by mouth daily as needed for pain.   Yes [provider]  methylPREDNISolone (MEDROL) 4  MG tablet Take 4 mg by mouth daily.   Yes [provider]  Vitamin D, Ergocalciferol, (DRISDOL) 50000 units CAPS capsule Take 50,000 Units by mouth every 7 (seven) days.   Yes [provider]  gabapentin (NEURONTIN) 300 MG capsule  12/10/15   [provider]  predniSONE (DELTASONE) 5 MG tablet  09/27/15   [provider]  promethazine (PHENERGAN) 12.5 MG tablet Take 1 tablet (12.5 mg total) by mouth every 6 (six) hours as needed for nausea or vomiting. 02/03/18   Willy Eddy, MD  traMADol (ULTRAM) 50 MG tablet Take 1 tablet (50 mg total) by mouth every 6 (six) hours as needed. Patient not taking: Reported on 03/25/2018 02/03/18 02/03/19  Willy Eddy, MD      VITAL SIGNS:  Blood pressure 93/68, pulse 96, temperature 99.1 F (37.3 C), temperature source Oral, resp. rate 20, height 5\' 4"  (1.626 m), weight 45.8 kg, SpO2 99 %.  PHYSICAL EXAMINATION:  Physical Exam  GENERAL:  62 y.o.-year-old thin patient lying in bed in no acute distress.  EYES: Pupils equal, round, reactive to light and accommodation. No scleral icterus. Extraocular muscles intact.  HEENT: Head atraumatic, normocephalic. Oropharynx and nasopharynx clear. No oropharyngeal erythema, moist oral mucosa  NECK:  Supple, no jugular venous distention. No thyroid enlargement, no tenderness.  LUNGS: Normal breath sounds bilaterally, no wheezing, rales, rhonchi. No use of accessory muscles of respiration.  CARDIOVASCULAR: S1, S2 RRR. No murmurs, rubs, gallops, clicks.  ABDOMEN: Soft, Tender in lower part of abdomen, No rebound, rigidity, nondistended. Bowel sounds present. No organomegaly or mass.  EXTREMITIES: No pedal edema, cyanosis, or clubbing. + 2 pedal & radial pulses b/l.   NEUROLOGIC: Cranial nerves II through XII are intact. No focal Motor or sensory deficits appreciated b/l PSYCHIATRIC: The patient is alert and oriented x 3.  SKIN: No obvious rash, lesion, or ulcer.   LABORATORY PANEL:    CBC No results for input(s): WBC, HGB, HCT, PLT in the last 168 hours. ------------------------------------------------------------------------------------------------------------------  Chemistries  No results for input(s): NA, K, CL, CO2, GLUCOSE, BUN, CREATININE, CALCIUM, MG, AST, ALT, ALKPHOS, BILITOT in the last 168 hours.  Invalid input(s): GFRCGP ------------------------------------------------------------------------------------------------------------------  Cardiac Enzymes No results for input(s): TROPONINI in the last 168 hours. ------------------------------------------------------------------------------------------------------------------  RADIOLOGY:  Dg Abd Acute W/chest  Result Date: 03/25/2018 CLINICAL DATA:  Lower abdominal pain. EXAM: DG ABDOMEN ACUTE W/ 1V CHEST COMPARISON:  CT scan and radiographs of February 03, 2018. FINDINGS: There is no evidence of dilated bowel loops or free intraperitoneal air. Moderate amount of stool seen throughout the colon. Phleboliths are noted in the pelvis. Heart size and mediastinal contours are within normal limits. Both lungs are clear. IMPRESSION: Moderate stool burden. No evidence of bowel obstruction or ileus. No acute cardiopulmonary disease. Electronically Signed   By: February 05, 2018, M.D.   On: 03/25/2018 12:38     IMPRESSION AND PLAN:   62 year old female with past medical history of rheumatoid arthritis/connective tissue disease, osteoarthritis, colon mass who presents to the hospital from direct admission  from the endoscopy suite.  1.  Colonic mass- patient was noted to have a sigmoid mass based on the CT scan of the abdomen pelvis a month ago.  She was scheduled for a colonoscopy today as an outpatient but could not tolerate the prep due to some nausea. - Patient will get repeat colonoscopy tomorrow and plan for half colonoscopy prep tonight and half tomorrow morning.  Patient to receive antiemetics with her prep while in the  hospital.  Gastroenterology to be consulted and patient to have repeat colonoscopy tomorrow.  2.  Nausea/vomiting secondary to the colonoscopy prep. -Supportive care with clear liquid diet, antiemetics.  3.  History of rheumatoid arthritis/connective tissue disease-continue maintenance methylprednisolone, Plaquenil.    All the records are reviewed and case discussed with ED provider. Management plans discussed with the patient, family and they are in agreement.  CODE STATUS: Full code  TOTAL TIME TAKING CARE OF THIS PATIENT: 40 minutes.    Houston Siren M.D on 03/25/2018 at 4:36 PM  Between 7am to 6pm - Pager - [phone]  After 6pm go to [email or web address] - password [email or web address]  [facility]  Office  [phone]  CC: Primary care physician; System, Pcp Not In

## 2018-03-25 NOTE — Anesthesia Postprocedure Evaluation (Signed)
Anesthesia Post Note  Patient: Morgan Reid  Procedure(s) Performed: COLONOSCOPY WITH PROPOFOL (N/A ) ESOPHAGOGASTRODUODENOSCOPY (EGD) WITH PROPOFOL (N/A )  Patient location during evaluation: Endoscopy Anesthesia Type: General Level of consciousness: awake and alert and oriented Pain management: pain level controlled Vital Signs Assessment: post-procedure vital signs reviewed and stable Respiratory status: spontaneous breathing Cardiovascular status: blood pressure returned to baseline Anesthetic complications: no     Last Vitals:  Vitals:   03/25/18 1335 03/25/18 1345  BP: 116/61 109/74  Pulse: 82 83  Resp: 16 16  Temp:    SpO2:  100%    Last Pain:  Vitals:   03/25/18 1345  TempSrc:   PainSc: 0-No pain                 Makyiah Lie

## 2018-03-25 NOTE — Consult Note (Signed)
GI Inpatient Consult Note Morgan Deem MD  Reason for Consult: Abdominal pain, nausea and vomiting, abnormal CT scan possible colonic mass, weight loss.  Needing assistance with management.   Attending Requesting Consult: Dr. Barnetta Reid  Outpatient Primary Physician: Morgan Forster, MD  History of Present Illness: Morgan Reid is a 62 y.o. female who presented today for EGD and colonoscopy.  She has a history of presenting for a colonoscopy about 3 months ago however that could not be accomplished due to poor prep.  At that time she had a CT scan indicating a lesion in the sigmoid region differential diagnosis of diverticulitis versus possible other lesion in the colon such as malignancy.  Treated for diverticulitis and her initial colonoscopy was arranged.  Over the prep was insufficient to be able to advance even into the sigmoid.  Did not tolerate the prep had a lot of nausea and vomiting.  In the interim symptoms in regards to abdominal pain bloating nausea and vomiting have worsened.  A repeat CT scan on 02/03/2018 was more concerning for "focal circumferential proximal sigmoid colonic wall thickening with pericolic pericolonic edema with the colon wall thickening and stranding.  Differential considerations including colonic neoplasm or acute diverticulitis..  Also a moderate to large volume of stool in the more proximal colon adjusting chronic process and increasing the likelihood of a malignancy.  Patient for direct visualization with colonoscopy.  She presented today for this procedure.  Also had an EGD that showed erosive esophagitis.  However there was still formed stool throughout the colon and I was unable to advance beyond the distal sigmoid due to this.  Requested admission just with management and repeat colonoscopy tomorrow in the setting of nausea vomiting increasing abdominal pain and abnormal CT scan with possible mass in the sigmoid colon marked weight loss.  Past  Medical History:  Past Medical History:  Diagnosis Date  . Anemia   . Arthralgia   . Arthralgia   . Collagen vascular disease (HCC)    Rheumatoid Arthritis  . Connective tissue disease (HCC)    a. S/p Methotrexate. b. Plaquenil. c. Prednisone.   . Connective tissue disease (HCC)    S/P a.Methotrexate, b. Plaquenil c. Prednisone    Problem List: There are no active problems to display for this patient.   Past Surgical History: Past Surgical History:  Procedure Laterality Date  . COLONOSCOPY     poor prep, rescheduled for 02/03/2018    Allergies: No Known Allergies  Home Medications: Medications Prior to Admission  Medication Sig Dispense Refill Last Dose  . dicyclomine (BENTYL) 20 MG tablet Take 20 mg by mouth every 6 (six) hours.     . hydroxychloroquine (PLAQUENIL) 200 MG tablet Take 200 mg by mouth every other day.   Past Week at Unknown time  . meloxicam (MOBIC) 7.5 MG tablet Take 7.5 mg by mouth daily as needed for pain.   Past Week at Unknown time  . methylPREDNISolone (MEDROL) 4 MG tablet Take 4 mg by mouth daily.   Past Week at Unknown time  . Vitamin D, Ergocalciferol, (DRISDOL) 50000 units CAPS capsule Take 50,000 Units by mouth every 7 (seven) days.     Marland Kitchen gabapentin (NEURONTIN) 300 MG capsule    Taking  . predniSONE (DELTASONE) 5 MG tablet    Taking  . promethazine (PHENERGAN) 12.5 MG tablet Take 1 tablet (12.5 mg total) by mouth every 6 (six) hours as needed for nausea or vomiting. 12 tablet 0   .  traMADol (ULTRAM) 50 MG tablet Take 1 tablet (50 mg total) by mouth every 6 (six) hours as needed. (Patient not taking: Reported on 03/25/2018) 10 tablet 0 Not Taking at Unknown time   Home medication reconciliation was completed with the patient.   Scheduled Inpatient Medications:    Continuous Inpatient Infusions:   . sodium chloride 20 mL/hr at 03/25/18 1121    PRN Inpatient Medications:    Family History: family history is not on file.   GI Family History:  Family history negative for colon polyps colorectal cancer.  Social History:   reports that she has quit smoking. She has never used smokeless tobacco. She reports that she has current or past drug history. She reports that she does not drink alcohol. The patient denies ETOH, tobacco, or drug use.   ROS  Review of Systems:  Physical Examination: BP (!) 91/55   Pulse (P) 88   Temp (!) (P) 97 F (36.1 C) (Tympanic)   Resp 13   Ht 5\' 4"  (1.626 m)   Wt 45.8 kg   SpO2 99%   BMI 17.34 kg/m  Gen: 62 year old female nauseated complaint of nausea and generalized abdominal discomfort and bloating HEENT: Cephalic atraumatic eyes are anicteric Neck: Supple no JVD Chest: Bilaterally clear to auscultation CV: Rhythm without rub or gallop Abd: Mildly distended bowel sounds are positive effect in the right lower quadrant.  She is minimally tender.  There is no rebound. Ext: Clubbing cyanosis or edema Skin: Other:  Data: Lab Results  Component Value Date   WBC 7.5 02/02/2018   HGB 11.4 (L) 02/02/2018   HCT 35.1 02/02/2018   MCV 74.0 (L) 02/02/2018   PLT 318 02/02/2018   No results for input(s): HGB in the last 168 hours. Lab Results  Component Value Date   NA 139 02/02/2018   K 3.1 (L) 02/02/2018   CL 104 02/02/2018   CO2 23 02/02/2018   BUN 8 02/02/2018   CREATININE 0.64 02/02/2018   Lab Results  Component Value Date   ALT 7 02/02/2018   AST 19 02/02/2018   ALKPHOS 95 02/02/2018   BILITOT 0.8 02/02/2018   No results for input(s): APTT, INR, PTT in the last 168 hours. CBC Latest Ref Rng & Units 02/02/2018 12/13/2015  WBC 3.6 - 11.0 K/uL 7.5 4.0  Hemoglobin 12.0 - 16.0 g/dL 11.4(L) 9.9(L)  Hematocrit 35.0 - 47.0 % 35.1 30.9(L)  Platelets 150 - 440 K/uL 318 181    STUDIES: Dg Abd Acute W/chest  Result Date: 03/25/2018 CLINICAL DATA:  Lower abdominal pain. EXAM: DG ABDOMEN ACUTE W/ 1V CHEST COMPARISON:  CT scan and radiographs of February 03, 2018. FINDINGS: There is no  evidence of dilated bowel loops or free intraperitoneal air. Moderate amount of stool seen throughout the colon. Phleboliths are noted in the pelvis. Heart size and mediastinal contours are within normal limits. Both lungs are clear. IMPRESSION: Moderate stool burden. No evidence of bowel obstruction or ileus. No acute cardiopulmonary disease. Electronically Signed   By: February 05, 2018, M.D.   On: 03/25/2018 12:38   @IMAGES @  Assessment: 1.  Abnormal CT scan of the abdomen pelvis.  This is very concerning for malignancy.  Patient has been unable to tolerate prep on an outpatient basis and is continuing to have increased symptoms of nausea vomiting abdominal pain and weight loss.  Prep was not adequate this morning to proceed with colonoscopy for diagnosis.  EGD did show some evidence of erosive gastritis as noted  above  Recommendations: 1.  Continue daily proton pump inhibitor, Protonix 40 mg p.o. 2.  Appreciate admission assistance from [facility] I will follow with you 3.  Clear liquid diet today 4.  Split colon prep some this evening some early tomorrow morning.  Patient will likely require antinausea medications to assist with this. Case discussed with Dr. Cherlynn Kaiser  Thank you for the consult. Please call with questions or concerns.  Morgan Deem, MD  03/25/2018 1:41 PM

## 2018-03-25 NOTE — Anesthesia Post-op Follow-up Note (Signed)
Anesthesia QCDR form completed.        

## 2018-03-25 NOTE — Op Note (Addendum)
[facility] Gastroenterology Patient Name: Morgan Reid Procedure Date: 03/25/2018 12:56 PM MRN: [id number] Account #: [id number] Date of Birth: [birth year] Admit Type: Outpatient Age: 62 Room: [facility] ENDO ROOM 2 Gender: Female Note Status: Finalized Procedure:            Colonoscopy Indications:          Abnormal CT of the GI tract Providers:            [name], MD Referring MD:         No Local Md, MD (Referring MD) Medicines:            Monitored Anesthesia Care Complications:        No immediate complications. Procedure:            Pre-Anesthesia Assessment:                       - ASA Grade Assessment: III - A patient with severe                        systemic disease.                       After obtaining informed consent, the colonoscope was                        passed under direct vision. Throughout the procedure,                        the patient's blood pressure, pulse, and oxygen                        saturations were monitored continuously. The                        Colonoscope was introduced through the anus with the                        intention of advancing to the splenic flexure. The                        scope was advanced to the sigmoid colon before the                        procedure was aborted. Medications were given. The                        colonoscopy was extremely difficult due to poor bowel                        prep with stool present. Findings:      A large amount of liquid semi-solid solid stool was found in the rectum       and in the sigmoid colon, precluding visualization.      The digital rectal exam was normal. Impression:           - Stool in the rectum and in the sigmoid colon.                       - No specimens collected. Recommendation:       - Admit the patient to hospital ward prior  to repeat                        procedure.                       - Put patient on a clear liquid diet starting  today.                       - repeat colonoscopy prep. Will discuss further with                        admitting physician. Procedure Code(s):    --- Professional ---                       [phone], 53, Colonoscopy, flexible; diagnostic, including                        collection of specimen(s) by brushing or washing, when                        performed (separate procedure) Diagnosis Code(s):    --- Professional ---                       R93.3, Abnormal findings on diagnostic imaging of other                        parts of digestive tract CPT copyright 2017 American Medical Association. All rights reserved. The codes documented in this report are preliminary and upon coder review may  be revised to meet current compliance requirements. [name], MD 03/25/2018 1:28:30 PM This report has been signed electronically. Number of Addenda: 0 Note Initiated On: 03/25/2018 12:56 PM Total Procedure Duration: 0 hours 2 minutes 14 seconds       [facility]

## 2018-03-25 NOTE — Anesthesia Preprocedure Evaluation (Signed)
Anesthesia Evaluation  Patient identified by MRN, date of birth, ID band Patient awake    Reviewed: Allergy & Precautions, NPO status , Patient's Chart, lab work & pertinent test results  Airway Mallampati: III       Dental   Pulmonary former smoker,    Pulmonary exam normal        Cardiovascular negative cardio ROS Normal cardiovascular exam     Neuro/Psych negative neurological ROS  negative psych ROS   GI/Hepatic Neg liver ROS,   Endo/Other  negative endocrine ROS  Renal/GU negative Renal ROS  negative genitourinary   Musculoskeletal  (+) Arthritis , Rheumatoid disorders,    Abdominal Normal abdominal exam  (+)   Peds negative pediatric ROS (+)  Hematology  (+) anemia ,   Anesthesia Other Findings Past Medical History: No date: Anemia No date: Arthralgia No date: Arthralgia No date: Collagen vascular disease (HCC)     Comment:  Rheumatoid Arthritis No date: Connective tissue disease (HCC)     Comment:  a. S/p Methotrexate. b. Plaquenil. c. Prednisone.  No date: Connective tissue disease (HCC)     Comment:  S/P a.Methotrexate, b. Plaquenil c. Prednisone  Reproductive/Obstetrics                             Anesthesia Physical Anesthesia Plan  ASA: III  Anesthesia Plan: General   Post-op Pain Management:    Induction: Intravenous  PONV Risk Score and Plan: TIVA and Propofol infusion  Airway Management Planned: Nasal Cannula  Additional Equipment:   Intra-op Plan:   Post-operative Plan:   Informed Consent: I have reviewed the patients History and Physical, chart, labs and discussed the procedure including the risks, benefits and alternatives for the proposed anesthesia with the patient or authorized representative who has indicated his/her understanding and acceptance.   Dental advisory given  Plan Discussed with: CRNA and Surgeon  Anesthesia Plan Comments:          Anesthesia Quick Evaluation

## 2018-03-26 ENCOUNTER — Encounter
Admission: AD | Disposition: A | Discharge status: Home / Self Care | Payer: Self-pay | Source: Home / Self Care | Attending: General Surgery

## 2018-03-26 ENCOUNTER — Observation Stay: Payer: BC Managed Care – PPO | Admitting: Anesthesiology

## 2018-03-26 DIAGNOSIS — D638 Anemia in other chronic diseases classified elsewhere: Secondary | ICD-10-CM | POA: Diagnosis present

## 2018-03-26 DIAGNOSIS — Z7952 Long term (current) use of systemic steroids: Secondary | ICD-10-CM | POA: Diagnosis not present

## 2018-03-26 DIAGNOSIS — K449 Diaphragmatic hernia without obstruction or gangrene: Secondary | ICD-10-CM | POA: Diagnosis present

## 2018-03-26 DIAGNOSIS — E876 Hypokalemia: Secondary | ICD-10-CM | POA: Diagnosis present

## 2018-03-26 DIAGNOSIS — K639 Disease of intestine, unspecified: Secondary | ICD-10-CM | POA: Diagnosis not present

## 2018-03-26 DIAGNOSIS — G8929 Other chronic pain: Secondary | ICD-10-CM | POA: Diagnosis present

## 2018-03-26 DIAGNOSIS — M069 Rheumatoid arthritis, unspecified: Secondary | ICD-10-CM | POA: Diagnosis present

## 2018-03-26 DIAGNOSIS — K2960 Other gastritis without bleeding: Secondary | ICD-10-CM | POA: Diagnosis present

## 2018-03-26 DIAGNOSIS — E43 Unspecified severe protein-calorie malnutrition: Secondary | ICD-10-CM | POA: Diagnosis present

## 2018-03-26 DIAGNOSIS — Z681 Body mass index (BMI) 19 or less, adult: Secondary | ICD-10-CM | POA: Diagnosis not present

## 2018-03-26 DIAGNOSIS — R112 Nausea with vomiting, unspecified: Secondary | ICD-10-CM | POA: Diagnosis present

## 2018-03-26 DIAGNOSIS — K6389 Other specified diseases of intestine: Secondary | ICD-10-CM | POA: Diagnosis present

## 2018-03-26 DIAGNOSIS — K5669 Other partial intestinal obstruction: Secondary | ICD-10-CM | POA: Diagnosis present

## 2018-03-26 DIAGNOSIS — D509 Iron deficiency anemia, unspecified: Secondary | ICD-10-CM | POA: Diagnosis present

## 2018-03-26 DIAGNOSIS — K2210 Ulcer of esophagus without bleeding: Secondary | ICD-10-CM | POA: Diagnosis present

## 2018-03-26 DIAGNOSIS — Z87891 Personal history of nicotine dependence: Secondary | ICD-10-CM | POA: Diagnosis not present

## 2018-03-26 DIAGNOSIS — L0390 Cellulitis, unspecified: Secondary | ICD-10-CM | POA: Diagnosis present

## 2018-03-26 HISTORY — PX: COLONOSCOPY WITH PROPOFOL: SHX5780

## 2018-03-26 LAB — CEA: CEA1: 1.3 ng/mL (ref 0.0–4.7)

## 2018-03-26 SURGERY — COLONOSCOPY WITH PROPOFOL
Anesthesia: General

## 2018-03-26 MED ORDER — ONDANSETRON HCL 4 MG/2ML IJ SOLN
4.0000 mg | Freq: Four times a day (QID) | INTRAMUSCULAR | Status: DC
  Administered 2018-03-26 – 2018-04-12 (×20): 4 mg via INTRAVENOUS
  Filled 2018-03-26 (×27): qty 2

## 2018-03-26 MED ORDER — POTASSIUM CHLORIDE 10 MEQ/100ML IV SOLN
10.0000 meq | INTRAVENOUS | Status: AC
  Administered 2018-03-26 (×3): 10 meq via INTRAVENOUS
  Filled 2018-03-26 (×3): qty 100

## 2018-03-26 MED ORDER — IOPAMIDOL (ISOVUE-300) INJECTION 61%
15.0000 mL | INTRAVENOUS | Status: AC
  Administered 2018-03-27 (×2): 15 mL via ORAL

## 2018-03-26 MED ORDER — SODIUM CHLORIDE 0.9 % IV SOLN
INTRAVENOUS | Status: DC | PRN
  Administered 2018-03-26: 1000 mL via INTRAVENOUS
  Administered 2018-04-05: 15:00:00 via INTRAVENOUS
  Administered 2018-04-05: 1000 mL via INTRAVENOUS
  Administered 2018-04-05: 18:00:00 via INTRAVENOUS
  Administered 2018-04-06 (×2): 250 mL via INTRAVENOUS

## 2018-03-26 MED ORDER — SODIUM CHLORIDE 0.9 % IV SOLN
INTRAVENOUS | Status: DC
  Administered 2018-03-26: 1000 mL via INTRAVENOUS

## 2018-03-26 MED ORDER — PROPOFOL 500 MG/50ML IV EMUL
INTRAVENOUS | Status: DC | PRN
  Administered 2018-03-26: 150 ug/kg/min via INTRAVENOUS

## 2018-03-26 MED ORDER — LIDOCAINE HCL (CARDIAC) PF 100 MG/5ML IV SOSY
PREFILLED_SYRINGE | INTRAVENOUS | Status: DC | PRN
  Administered 2018-03-26: 60 mg via INTRATRACHEAL

## 2018-03-26 MED ORDER — PROPOFOL 500 MG/50ML IV EMUL
INTRAVENOUS | Status: AC
  Filled 2018-03-26: qty 50

## 2018-03-26 MED ORDER — PHENYLEPHRINE HCL 10 MG/ML IJ SOLN
INTRAMUSCULAR | Status: DC | PRN
  Administered 2018-03-26 (×2): 100 ug via INTRAVENOUS

## 2018-03-26 MED ORDER — LIDOCAINE HCL (PF) 2 % IJ SOLN
INTRAMUSCULAR | Status: AC
  Filled 2018-03-26: qty 10

## 2018-03-26 MED ORDER — PROPOFOL 10 MG/ML IV BOLUS
INTRAVENOUS | Status: DC | PRN
  Administered 2018-03-26: 50 mg via INTRAVENOUS
  Administered 2018-03-26: 20 mg via INTRAVENOUS

## 2018-03-26 NOTE — Progress Notes (Signed)
Initial Nutrition Assessment  DOCUMENTATION CODES:   Underweight  INTERVENTION:   RD will order supplements and vitamins when diet advanced   NUTRITION DIAGNOSIS:   Inadequate oral intake related to acute illness as evidenced by NPO status.  GOAL:   Patient will meet greater than or equal to 90% of their needs  MONITOR:   Diet advancement, Labs, Weight trends, Skin, I & O's  REASON FOR ASSESSMENT:   Malnutrition Screening Tool    ASSESSMENT:   62 y.o. female with a known history of collagen vascular disease, rheumatoid arthritis, chronic anemia who presents with possible colon mass    Pt s/p EGD 8/29 noted to have hiatal hernia and gastritis  Unable to see pt today; pt in procedure at time of RD visit. Per chart, pt with 23lb(18%) wt loss since March; per chart, wt loss was unintentional. Pt NPO for colonoscopy today. Pt is underweight; RD suspects pt with malnutrition but unable to diagnose at this time. RD will obtain nutrition related history and exam at follow up. Of note, pt with microcytic anemia; would recommend check iron/anemia labs to r/o deficiency. Suspect pt at refeeding risk; recommend monitor K, Mg and P labs.   Medications reviewed and include: zofran  Labs reviewed: K 3.3(L) Hgb 9.0(L), Hct 27.8(L), MCV 75(L), MCH 24.2(L)  Unable to complete Nutrition-Focused physical exam at this time.   Diet Order:   Diet Order            Diet NPO time specified  Diet effective now             EDUCATION NEEDS:   Not appropriate for education at this time  Skin:  Skin Assessment: Reviewed RN Assessment(ecchymosis )  Last BM:  8/30- type 7  Height:   Ht Readings from Last 1 Encounters:  03/26/18 5\' 4"  (1.626 m)    Weight:   Wt Readings from Last 1 Encounters:  03/26/18 44.5 kg    Ideal Body Weight:  54.5 kg  BMI:  Body mass index is 16.82 kg/m.  Estimated Nutritional Needs:   Kcal:  1300-1500kcal/day   Protein:  70-78g/day   Fluid:   >1.3L/day   03/28/18 MS, RD, LDN Pager #- [phone] Office#- [phone] After Hours Pager: [phone]

## 2018-03-26 NOTE — Consult Note (Signed)
SURGICAL CONSULTATION NOTE   HISTORY OF PRESENT ILLNESS (HPI):  62 y.o. female admitted to the hospital due to abdominal pain with colonic stricture in need of Colonoscopy and was not able to tolerate bowel prep at home.   Patient reports having abdominal pian since more than 3 months ago. She has been evaluated by Primary Care and Gastroenterologist and extensive workup has been done. Patient has had multiple trial of colonoscopy due to a suspected mass vs diverticular stricture. Patient refers abdominal pain is generalized and not being able to localize it. Pain does not radiates. Pain is aggravated with eating. Pain is improved with Aleve. Patient refers in the last few months has not been able to eat well due to the pain with chronic nausea and vomiting. Refers loosing 20-30 pounds in the last 2 months due to not being able to tolerate diet. Denies fever or chills episodes. Refers being able to have bowel movements every day.  Surgery is consulted by Dr. Marva Panda in this context for evaluation and management of sigmoid stricture.  PAST MEDICAL HISTORY (PMH):  Past Medical History:  Diagnosis Date  . Anemia   . Arthralgia   . Arthralgia   . Collagen vascular disease (HCC)    Rheumatoid Arthritis  . Connective tissue disease (HCC)    a. S/p Methotrexate. b. Plaquenil. c. Prednisone.   . Connective tissue disease (HCC)    S/P a.Methotrexate, b. Plaquenil c. Prednisone     PAST SURGICAL HISTORY (PSH):  Past Surgical History:  Procedure Laterality Date  . COLONOSCOPY     poor prep, rescheduled for 02/03/2018     MEDICATIONS:  Prior to Admission medications   Medication Sig Start Date End Date Taking? Authorizing Provider  hydroxychloroquine (PLAQUENIL) 200 MG tablet Take 200 mg by mouth every other day.   Yes [provider]  meloxicam (MOBIC) 7.5 MG tablet Take 7.5 mg by mouth daily as needed for pain.   Yes [provider]  methylPREDNISolone (MEDROL) 4 MG  tablet Take 4 mg by mouth daily.   Yes [provider]  polyethylene glycol-electrolytes (NULYTELY/GOLYTELY) 420 g solution Take 4,000 mLs by mouth once.    [provider]  promethazine (PHENERGAN) 12.5 MG tablet Take 1 tablet (12.5 mg total) by mouth every 6 (six) hours as needed for nausea or vomiting. Patient not taking: Reported on 03/25/2018 02/03/18   Willy Eddy, MD  traMADol (ULTRAM) 50 MG tablet Take 1 tablet (50 mg total) by mouth every 6 (six) hours as needed. Patient not taking: Reported on 03/25/2018 02/03/18 02/03/19  Willy Eddy, MD     ALLERGIES:  No Known Allergies   SOCIAL HISTORY:  Social History   Socioeconomic History  . Marital status: Single    Spouse name: Not on file  . Number of children: Not on file  . Years of education: Not on file  . Highest education level: Not on file  Occupational History  . Not on file  Social Needs  . Financial resource strain: Not on file  . Food insecurity:    Worry: Not on file    Inability: Not on file  . Transportation needs:    Medical: Not on file    Non-medical: Not on file  Tobacco Use  . Smoking status: Former Smoker    Packs/day: 0.25    Years: 30.00    Pack years: 7.50    Types: Cigarettes  . Smokeless tobacco: Never Used  Substance and Sexual Activity  . Alcohol  use: No  . Drug use: Not Currently  . Sexual activity: Not on file  Lifestyle  . Physical activity:    Days per week: Not on file    Minutes per session: Not on file  . Stress: Not on file  Relationships  . Social connections:    Talks on phone: Not on file    Gets together: Not on file    Attends religious service: Not on file    Active member of club or organization: Not on file    Attends meetings of clubs or organizations: Not on file    Relationship status: Not on file  . Intimate partner violence:    Fear of current or ex partner: Not on file    Emotionally abused: Not on file    Physically abused: Not on  file    Forced sexual activity: Not on file  Other Topics Concern  . Not on file  Social History Narrative  . Not on file    The patient currently resides (home / rehab facility / nursing home): Home The patient normally is (ambulatory / bedbound): Ambulatory   FAMILY HISTORY:  Family History  Problem Relation Age of Onset  . Schizophrenia Sister      REVIEW OF SYSTEMS:  Constitutional: denies fever, chills, or sweats. Positive for weight loss Eyes: denies any other vision changes, history of eye injury  ENT: denies sore throat, hearing problems  Respiratory: denies shortness of breath, wheezing  Cardiovascular: denies chest pain, palpitations  Gastrointestinal: positive for abdominal pain, N/V Genitourinary: denies burning with urination or urinary frequency Musculoskeletal: denies any other joint pains or cramps  Skin: denies any other rashes or skin discolorations  Neurological: denies any other headache, dizziness, weakness  Psychiatric: denies any other depression, anxiety   All other review of systems were negative   VITAL SIGNS:  Temp:  [97.1 F (36.2 C)-99.4 F (37.4 C)] 97.1 F (36.2 C) (08/30 1459) Pulse Rate:  [81-99] 81 (08/30 1519) Resp:  [16-20] 16 (08/30 1519) BP: (119-131)/(66-91) 129/91 (08/30 1519) SpO2:  [99 %-100 %] 100 % (08/30 1519) Weight:  [44.5 kg] 44.5 kg (08/30 1336)     Height: 5\' 4"  (162.6 cm) Weight: 44.5 kg BMI (Calculated): 16.81   INTAKE/OUTPUT:  This shift: Total I/O In: 500 [I.V.:500] Out: -   Last 2 shifts: @IOLAST2SHIFTS @   PHYSICAL EXAM:  Constitutional:  -- Normal body habitus  -- Awake, alert, and oriented x3  Eyes:  -- Pupils equally round and reactive to light  -- No scleral icterus  Ear, nose, and throat:  -- No jugular venous distension  Pulmonary:  -- No crackles  -- Equal breath sounds bilaterally -- Breathing non-labored at rest Cardiovascular:  -- S1, S2 present  -- No pericardial rubs Gastrointestinal:   -- Abdomen soft, nontender, non-distended, no guarding or rebound tenderness -- No abdominal masses appreciated, pulsatile or otherwise  Musculoskeletal and Integumentary:  -- Wounds or skin discoloration: None appreciated -- Extremities: B/L UE and LE FROM, hands and feet warm, no edema  Neurologic:  -- Motor function: intact and symmetric -- Sensation: intact and symmetric   Labs:  CBC Latest Ref Rng & Units 03/25/2018 02/02/2018 12/13/2015  WBC 3.6 - 11.0 K/uL 3.7 7.5 4.0  Hemoglobin 12.0 - 16.0 g/dL 9.0(L) 11.4(L) 9.9(L)  Hematocrit 35.0 - 47.0 % 27.8(L) 35.1 30.9(L)  Platelets 150 - 440 K/uL 236 318 181   CMP Latest Ref Rng & Units 03/25/2018 02/02/2018 06/17/2017  Glucose 70 -  99 mg/dL 01(X) 793(J) -  BUN 8 - 23 mg/dL 9 8 -  Creatinine 0.30 - 1.00 mg/dL 0.92 3.30 0.76  Sodium 135 - 145 mmol/L 140 139 -  Potassium 3.5 - 5.1 mmol/L 3.3(L) 3.1(L) -  Chloride 98 - 111 mmol/L 106 104 -  CO2 22 - 32 mmol/L 26 23 -  Calcium 8.9 - 10.3 mg/dL 2.2(Q) 9.3 -  Total Protein 6.5 - 8.1 g/dL - 8.6(H) -  Total Bilirubin 0.3 - 1.2 mg/dL - 0.8 -  Alkaline Phos 38 - 126 U/L - 95 -  AST 15 - 41 U/L - 19 -  ALT 0 - 44 U/L - 7 -   CEA: 1.3  Imaging studies:  Colonoscopy images and report reviewed and narrowing of sigmoid colon seen. Colonoscopy discussed with Dr. Marva Panda.  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 Patient with a 3 months history of abdominal pain and poor oral intake. This raise the concern of malnutrition. With the colonoscopic finding, irrelevant of the etiology, patient most likely will need sigmoidectomy. The question is if patient will need a colostomy or protective ileostomy. To gather more  information to help with the decision making, CT scan will help to re evaluate how dilated is the proximal colon. On CT scan on July, it was dilated. Also how much more bowel prep will patient tolerate to be able to get a cleaner colon to decrease the risk of leak. Also need to assess nutrition status. If patient severely malnourished, primary anastomosis has higher changes of leak.   My recommendations are to order a CT scan with IV and oral contrast to evaluate the colon as well as the liver in case this is a malignant tumor (CT scan ordered). Also will assess nutrition status.   All this information was discussed with patient and refers it was a lot of information to deal with. I oriented the patient that I will come back tomorrow and will prefer to have family members involve in the conversation.   Gae Gallop, MD

## 2018-03-26 NOTE — Anesthesia Post-op Follow-up Note (Signed)
Anesthesia QCDR form completed.        

## 2018-03-26 NOTE — Progress Notes (Signed)
[employer] - [place] at [facility]                                                                                                                                                                                  Patient Demographics   Morgan Reid, is a 62 y.o. female, DOB - 1956/02/09, ZOX:[id number]  Admit date - 03/25/2018   Admitting Physician Houston Siren, MD  Outpatient Primary MD for the patient is System, Pcp Not In   LOS - 0  Subjective: Patient admitted with colonic mass results of colonoscopy noted patient noted to have stenosis at about 27 cm Patient has had significant weight loss    Review of Systems:   CONSTITUTIONAL: No documented fever. No fatigue, weakness. No weight gain, no weight loss.  EYES: No blurry or double vision.  ENT: No tinnitus. No postnasal drip. No redness of the oropharynx.  RESPIRATORY: No cough, no wheeze, no hemoptysis. No dyspnea.  CARDIOVASCULAR: No chest pain. No orthopnea. No palpitations. No syncope.  GASTROINTESTINAL: No nausea, no vomiting or diarrhea. No abdominal pain. No melena or hematochezia.  GENITOURINARY: No dysuria or hematuria.  ENDOCRINE: No polyuria or nocturia. No heat or cold intolerance.  HEMATOLOGY: No anemia. No bruising. No bleeding.  INTEGUMENTARY: No rashes. No lesions.  MUSCULOSKELETAL: No arthritis. No swelling. No gout.  NEUROLOGIC: No numbness, tingling, or ataxia. No seizure-type activity.  PSYCHIATRIC: No anxiety. No insomnia. No ADD.    Vitals:   Vitals:   03/26/18 1336 03/26/18 1459 03/26/18 1509 03/26/18 1519  BP: 119/81 120/73 130/82 (!) 129/91  Pulse: 99 90 97 81  Resp: 18 16 16 16   Temp: 97.8 F (36.6 C) (!) 97.1 F (36.2 C)    TempSrc: Tympanic Tympanic    SpO2: 99% 100%  100%  Weight: 44.5 kg     Height: 5\' 4"  (1.626 m)       Wt Readings from Last 3 Encounters:  03/26/18 44.5 kg  02/02/18 52.2 kg  12/17/15 59.2 kg     Intake/Output Summary (Last 24 hours) at 03/26/2018  1615 Last data filed at 03/26/2018 1452 Gross per 24 hour  Intake 1360 ml  Output -  Net 1360 ml    Physical Exam:   GENERAL: Pleasant-appearing in no apparent distress.  HEAD, EYES, EARS, NOSE AND THROAT: Atraumatic, normocephalic. Extraocular muscles are intact. Pupils equal and reactive to light. Sclerae anicteric. No conjunctival injection. No oro-pharyngeal erythema.  NECK: Supple. There is no jugular venous distention. No bruits, no lymphadenopathy, no thyromegaly.  HEART: Regular rate and rhythm,. No murmurs, no rubs, no clicks.  LUNGS: Clear to auscultation bilaterally. No rales or rhonchi. No wheezes.  ABDOMEN: Soft, flat, nontender, nondistended. Has good bowel sounds. No hepatosplenomegaly appreciated.  EXTREMITIES: No evidence of any cyanosis, clubbing, or peripheral edema.  +2 pedal and radial pulses bilaterally.  NEUROLOGIC: The patient is alert, awake, and oriented x3 with no focal motor or sensory deficits appreciated bilaterally.  SKIN: Moist and warm with no rashes appreciated.  Psych: Not anxious, depressed LN: No inguinal LN enlargement    Antibiotics   Anti-infectives (From admission, onward)   Start     Dose/Rate Route Frequency Ordered Stop   03/25/18 1615  [MAR Hold]  hydroxychloroquine (PLAQUENIL) tablet 200 mg     (MAR Hold since Fri 03/26/2018 at 1335. Reason: Transfer to a Procedural area.)   200 mg Oral Every other day 03/25/18 1611        Medications   Scheduled Meds: . [MAR Hold] hydroxychloroquine  200 mg Oral QODAY  . [MAR Hold] methylPREDNISolone  4 mg Oral QPC breakfast  . [MAR Hold] ondansetron (ZOFRAN) IV  4 mg Intravenous Q6H  . [MAR Hold] polyethylene glycol-electrolytes  4,000 mL Oral UD   Continuous Infusions: . sodium chloride 1,000 mL (03/26/18 1354)  . potassium chloride     PRN Meds:.[MAR Hold] acetaminophen **OR** [MAR Hold] acetaminophen, [MAR Hold] ketorolac   Data Review:   Micro Results No results found for this or any  previous visit (from the past 240 hour(s)).  Radiology Reports Dg Abd Acute W/chest  Result Date: 03/25/2018 CLINICAL DATA:  Lower abdominal pain. EXAM: DG ABDOMEN ACUTE W/ 1V CHEST COMPARISON:  CT scan and radiographs of February 03, 2018. FINDINGS: There is no evidence of dilated bowel loops or free intraperitoneal air. Moderate amount of stool seen throughout the colon. Phleboliths are noted in the pelvis. Heart size and mediastinal contours are within normal limits. Both lungs are clear. IMPRESSION: Moderate stool burden. No evidence of bowel obstruction or ileus. No acute cardiopulmonary disease. Electronically Signed   By: Lupita Raider, M.D.   On: 03/25/2018 12:38     CBC Recent Labs  Lab 03/25/18 1635  WBC 3.7  HGB 9.0*  HCT 27.8*  PLT 236  MCV 75.0*  MCH 24.2*  MCHC 32.3  RDW 16.4*    Chemistries  Recent Labs  Lab 03/25/18 1635  NA 140  K 3.3*  CL 106  CO2 26  GLUCOSE 67*  BUN 9  CREATININE 0.50  CALCIUM 8.4*   ------------------------------------------------------------------------------------------------------------------ estimated creatinine clearance is 51.2 mL/min (by C-G formula based on SCr of 0.5 mg/dL). ------------------------------------------------------------------------------------------------------------------ No results for input(s): HGBA1C in the last 72 hours. ------------------------------------------------------------------------------------------------------------------ No results for input(s): CHOL, HDL, LDLCALC, TRIG, CHOLHDL, LDLDIRECT in the last 72 hours. ------------------------------------------------------------------------------------------------------------------ No results for input(s): TSH, T4TOTAL, T3FREE, THYROIDAB in the last 72 hours.  Invalid input(s): FREET3 ------------------------------------------------------------------------------------------------------------------ No results for input(s): VITAMINB12, FOLATE, FERRITIN,  TIBC, IRON, RETICCTPCT in the last 72 hours.  Coagulation profile No results for input(s): INR, PROTIME in the last 168 hours.  No results for input(s): DDIMER in the last 72 hours.  Cardiac Enzymes No results for input(s): CKMB, TROPONINI, MYOGLOBIN in the last 168 hours.  Invalid input(s): CK ------------------------------------------------------------------------------------------------------------------ Invalid input(s): POCBNP    Assessment & Plan   62 year old female with past medical history of rheumatoid arthritis/connective tissue disease, osteoarthritis, colon mass who presents to the hospital from direct admission from the endoscopy suite.  1.    Clonic stenosis surgery consult also ordered by GI Serology for inflammatory bowel ordered  2.  Nausea/vomiting secondary to the colonoscopy  prep. -Supportive care with clear liquid diet, antiemetics.  Vance diet as tolerated  3.  History of rheumatoid arthritis/connective tissue disease-continue maintenance methylprednisolone, Plaquenil.     Code Status Orders  (From admission, onward)         Start     Ordered   03/25/18 1529  Full code  Continuous     03/25/18 1530        Code Status History    This patient has a current code status but no historical code status.           Consults gastroenterology   DVT Prophylaxis  Lovenox   Lab Results  Component Value Date   PLT 236 03/25/2018     Time Spent in minutes    Greater than 50% of time spent in care coordination and counseling patient regarding the condition and plan of care.   Auburn Bilberry M.D on 03/26/2018 at 4:15 PM  Between 7am to 6pm - Pager - [phone]  After 6pm go to [email or web address] - Social research officer, government  [employer]   Office  [phone]

## 2018-03-26 NOTE — Anesthesia Preprocedure Evaluation (Signed)
Anesthesia Evaluation  Patient identified by MRN, date of birth, ID band Patient awake    Reviewed: Allergy & Precautions, NPO status , Patient's Chart, lab work & pertinent test results, reviewed documented beta blocker date and time   Airway Mallampati: II  TM Distance: >3 FB     Dental  (+) Chipped   Pulmonary former smoker,           Cardiovascular      Neuro/Psych    GI/Hepatic   Endo/Other    Renal/GU      Musculoskeletal   Abdominal   Peds  Hematology  (+) anemia ,   Anesthesia Other Findings Collagen vasc disease.  Reproductive/Obstetrics                             Anesthesia Physical Anesthesia Plan  ASA: III  Anesthesia Plan: General   Post-op Pain Management:    Induction: Intravenous  PONV Risk Score and Plan:   Airway Management Planned:   Additional Equipment:   Intra-op Plan:   Post-operative Plan:   Informed Consent: I have reviewed the patients History and Physical, chart, labs and discussed the procedure including the risks, benefits and alternatives for the proposed anesthesia with the patient or authorized representative who has indicated his/her understanding and acceptance.     Plan Discussed with: CRNA  Anesthesia Plan Comments:         Anesthesia Quick Evaluation

## 2018-03-26 NOTE — OR Nursing (Signed)
Unable to reach Cecum due to stenosis at 27 cm.

## 2018-03-26 NOTE — Transfer of Care (Signed)
Immediate Anesthesia Transfer of Care Note  Patient: Morgan Reid  Procedure(s) Performed: COLONOSCOPY WITH PROPOFOL (N/A )  Patient Location: PACU  Anesthesia Type:General  Level of Consciousness: sedated  Airway & Oxygen Therapy: Patient Spontanous Breathing  Post-op Assessment: Report given to RN  Post vital signs: stable  Last Vitals:  Vitals Value Taken Time  BP    Temp    Pulse    Resp    SpO2      Last Pain:  Vitals:   03/26/18 1336  TempSrc: Tympanic  PainSc: 9       Patients Stated Pain Goal: 0 (03/26/18 1336)  Complications: No apparent anesthesia complications

## 2018-03-26 NOTE — Anesthesia Post-op Follow-up Note (Deleted)
Anesthesia QCDR form completed.        

## 2018-03-26 NOTE — Op Note (Signed)
[facility] Gastroenterology Patient Name: Morgan Reid Procedure Date: 03/26/2018 2:20 PM MRN: [id number] Account #: [id number] Date of Birth: 07-08-1956 Admit Type: Outpatient Age: 62 Room: [facility] ENDO ROOM 1 Gender: Female Note Status: Finalized Procedure:            Colonoscopy Indications:          Lower abdominal pain, Abnormal CT of the GI tract Providers:            [name], MD Referring MD:         Ilda Foil. Algis Greenhouse (Referring MD) Medicines:            Monitored Anesthesia Care Complications:        No immediate complications. Procedure:            Pre-Anesthesia Assessment:                       - ASA Grade Assessment: III - A patient with severe                        systemic disease.                       After obtaining informed consent, the colonoscope was                        passed under direct vision. Throughout the procedure,                        the patient's blood pressure, pulse, and oxygen                        saturations were monitored continuously. The                        Colonoscope was introduced through the anus with the                        intention of advancing to the cecum. The scope was                        advanced to the sigmoid colon before the procedure was                        aborted. Medications were given. The colonoscopy was                        unusually difficult due to bowel stenosis. The patient                        tolerated the procedure well. Findings:      The scope was carefully advanced to about 27 cm from the anal verge.       There is an apparent stenosis at about 27 cm, with local edema and       evidence of inflammation however mild. There is no evidence of a unseen       bleeding lesion more proximally , without heme or tissue fragments seen       distally. There are multiple diverticuli in the area, with mild       inflamation/ edema noted at 2 or three.  Multiple biopsies are  taken in       the area between 25-27 cm from the anal verge. Impression:           Diverticular disease versus Crohns disease. Of note,                        Multiple ct have been done in the past couple of months                        with no evidence of skip lesions consistant with Crohns                        disease. Recommendation:       - Return patient to hospital ward for ongoing care.                       - Clear liquid diet.                       - Consult for surgical opinion, obtain inflammatory                        bowel disease serologies. Consider placing on                        antibiotics for emperic treatment of diverticulitis if                        evidence of increased wbc. Procedure Code(s):    --- Professional ---                       [id number], 53, Colonoscopy, flexible; diagnostic, including                        collection of specimen(s) by brushing or washing, when                        performed (separate procedure) Diagnosis Code(s):    --- Professional ---                       R10.30, Lower abdominal pain, unspecified                       R93.3, Abnormal findings on diagnostic imaging of other                        parts of digestive tract CPT copyright 2017 American Medical Association. All rights reserved. The codes documented in this report are preliminary and upon coder review may  be revised to meet current compliance requirements. [name], MD 03/26/2018 3:09:04 PM This report has been signed electronically. Number of Addenda: 0 Note Initiated On: 03/26/2018 2:20 PM Total Procedure Duration: 0 hours 27 minutes 51 seconds       [facility]

## 2018-03-26 NOTE — Consult Note (Addendum)
Subjective: Patient seen for abdominal pain, nausea and vomiting, abnormal CT scan.  Patient had presented yesterday for EGD and colonoscopy.  EGD was relatively normal with a mild erosive gastritis.  Colonoscopy prep was poor.  She has been very difficult prep as an outpatient.  Due to the gravity of the findings on the CT scan and the continued and worsening abdominal pain is been held overnight for reprep.  She did get down about two thirds of the prep that was half of a full prep last night.  He has apparently drank very little of the prep this morning.  She is complains of nausea and abdominal pain.  He has had several bowel movements but is still going relatively dark.  There is apparently no solids.  As reported.  Objective: Vital signs in last 24 hours: Temp:  [97 F (36.1 C)-99.4 F (37.4 C)] 99 F (37.2 C) (08/30 1242) Pulse Rate:  [82-99] 89 (08/30 1242) Resp:  [13-20] 20 (08/30 1242) BP: (91-131)/(55-80) 126/66 (08/30 1242) SpO2:  [97 %-100 %] 100 % (08/30 1242) Blood pressure 126/66, pulse 89, temperature 99 F (37.2 C), temperature source Oral, resp. rate 20, height 5\' 4"  (1.626 m), weight 45.8 kg, SpO2 100 %.   Intake/Output from previous day: 08/29 0701 - 08/30 0700 In: 1210 [P.O.:860; I.V.:350] Out: -   Intake/Output this shift: No intake/output data recorded.   General appearance: 62 year old female no distress. Resp: Clear to auscultation Cardio: Regular rate and rhythm GI: Soft mild diffuse discomfort.  There is a fullness in the left lower quadrant.  Bowel sounds are positive normoactive.  She is not distended.  No rebound. Extremities: No clubbing cyanosis or edema   Lab Results: Results for orders placed or performed during the hospital encounter of 03/25/18 (from the past 24 hour(s))  CBC     Status: Abnormal   Collection Time: 03/25/18  4:35 PM  Result Value Ref Range   WBC 3.7 3.6 - 11.0 K/uL   RBC 3.70 (L) 3.80 - 5.20 MIL/uL   Hemoglobin 9.0 (L)  12.0 - 16.0 g/dL   HCT 03/27/18 (L) 82.4 - 23.5 %   MCV 75.0 (L) 80.0 - 100.0 fL   MCH 24.2 (L) 26.0 - 34.0 pg   MCHC 32.3 32.0 - 36.0 g/dL   RDW 36.1 (H) 44.3 - 15.4 %   Platelets 236 150 - 440 K/uL  Basic metabolic panel     Status: Abnormal   Collection Time: 03/25/18  4:35 PM  Result Value Ref Range   Sodium 140 135 - 145 mmol/L   Potassium 3.3 (L) 3.5 - 5.1 mmol/L   Chloride 106 98 - 111 mmol/L   CO2 26 22 - 32 mmol/L   Glucose, Bld 67 (L) 70 - 99 mg/dL   BUN 9 8 - 23 mg/dL   Creatinine, Ser 03/27/18 0.44 - 1.00 mg/dL   Calcium 8.4 (L) 8.9 - 10.3 mg/dL   GFR calc non Af Amer >60 >60 mL/min   GFR calc Af Amer >60 >60 mL/min   Anion gap 8 5 - 15  CEA     Status: None   Collection Time: 03/25/18  4:35 PM  Result Value Ref Range   CEA 1.3 0.0 - 4.7 ng/mL     Recent Labs    03/25/18 1635  WBC 3.7  HGB 9.0*  HCT 27.8*  PLT 236   BMET Recent Labs    03/25/18 1635  NA 140  K 3.3*  CL 106  CO2 26  GLUCOSE 67*  BUN 9  CREATININE 0.50  CALCIUM 8.4*   LFT No results for input(s): PROT, ALBUMIN, AST, ALT, ALKPHOS, BILITOT, BILIDIR, IBILI in the last 72 hours. PT/INR No results for input(s): LABPROT, INR in the last 72 hours. Hepatitis Panel No results for input(s): HEPBSAG, HCVAB, HEPAIGM, HEPBIGM in the last 72 hours. C-Diff No results for input(s): CDIFFTOX in the last 72 hours. No results for input(s): CDIFFPCR in the last 72 hours.   Studies/Results: Dg Abd Acute W/chest  Result Date: 03/25/2018 CLINICAL DATA:  Lower abdominal pain. EXAM: DG ABDOMEN ACUTE W/ 1V CHEST COMPARISON:  CT scan and radiographs of February 03, 2018. FINDINGS: There is no evidence of dilated bowel loops or free intraperitoneal air. Moderate amount of stool seen throughout the colon. Phleboliths are noted in the pelvis. Heart size and mediastinal contours are within normal limits. Both lungs are clear. IMPRESSION: Moderate stool burden. No evidence of bowel obstruction or ileus. No acute  cardiopulmonary disease. Electronically Signed   By: Lupita Raider, M.D.   On: 03/25/2018 12:38    Scheduled Inpatient Medications:   . hydroxychloroquine  200 mg Oral QODAY  . methylPREDNISolone  4 mg Oral QPC breakfast  . ondansetron (ZOFRAN) IV  4 mg Intravenous Q6H  . polyethylene glycol-electrolytes  4,000 mL Oral UD    Continuous Inpatient Infusions:    PRN Inpatient Medications:  acetaminophen **OR** acetaminophen, ketorolac  Miscellaneous:   Assessment:  1.  Abnormal CT scan as noted , of note CEA is normal 2.  Iron deficiency/microcytic anemia 3.  Difficulty with colon prep  Plan:  1.  Will attempt colonoscopy today.  I am uncertain if her prep will be adequate today as she has refused to continue to try to drink the prep.  I discussed with the patient that if she is  not is not clear enough to proceed it may require [place] to continue her hospitalization and I have discussed this further with Dr. Tobi Bastos who will be available over the weekend to reattempt a colonoscopy if we can get a better prep.  This is not possible I would obtain a surgical consultation for opinion.  I have discussed the risk benefits complications of procedure to include not limited to bleeding infection perforation and the risk of sedation and she wishes to proceed.  [name] MD 03/26/2018, 12:58 PM

## 2018-03-26 NOTE — Consult Note (Addendum)
Please see Colonoscopy report.  There is a stenosis at about 27 cm from the anal verge.  It is unclear from the clinical appearance whether this is related to inflammatory bowel disease, (Crohns disease) or diverticular disease related. It is obvious there is a marked narrowing, and I was not able to pass the scope .  Recommend continued hospital admission with surgical consultation, labs for inflammatory bowel disease, consider emperic treatment for diverticulitis.    I have discussed with Dr Tobi Bastos, who will follow for GI over the weekend.

## 2018-03-26 NOTE — Anesthesia Postprocedure Evaluation (Signed)
Anesthesia Post Note  Patient: Morgan Reid  Procedure(s) Performed: COLONOSCOPY WITH PROPOFOL (N/A )  Patient location during evaluation: Endoscopy Anesthesia Type: General Level of consciousness: awake and alert Pain management: pain level controlled Vital Signs Assessment: post-procedure vital signs reviewed and stable Respiratory status: spontaneous breathing and respiratory function stable Cardiovascular status: stable Anesthetic complications: no     Last Vitals:  Vitals:   03/26/18 1459 03/26/18 1509  BP: 120/73 130/82  Pulse: 90 97  Resp: 16 16  Temp: (!) 36.2 C   SpO2: 100%     Last Pain:  Vitals:   03/26/18 1509  TempSrc:   PainSc: 9                  KEPHART,WILLIAM K

## 2018-03-27 ENCOUNTER — Encounter: Payer: Self-pay | Admitting: Radiology

## 2018-03-27 ENCOUNTER — Inpatient Hospital Stay: Payer: Self-pay

## 2018-03-27 ENCOUNTER — Inpatient Hospital Stay: Payer: BC Managed Care – PPO

## 2018-03-27 DIAGNOSIS — K639 Disease of intestine, unspecified: Secondary | ICD-10-CM

## 2018-03-27 LAB — COMPREHENSIVE METABOLIC PANEL
ALBUMIN: 2.3 g/dL — AB (ref 3.5–5.0)
ALK PHOS: 60 U/L (ref 38–126)
ALT: 7 U/L (ref 0–44)
AST: 12 U/L — AB (ref 15–41)
Anion gap: 11 (ref 5–15)
CALCIUM: 8.2 mg/dL — AB (ref 8.9–10.3)
CO2: 20 mmol/L — AB (ref 22–32)
CREATININE: 0.51 mg/dL (ref 0.44–1.00)
Chloride: 104 mmol/L (ref 98–111)
GFR calc Af Amer: >60 mL/min (ref >60)
GFR calc non Af Amer: >60 mL/min (ref >60)
GLUCOSE: 46 mg/dL — AB (ref 70–99)
Potassium: 3.7 mmol/L (ref 3.5–5.1)
SODIUM: 135 mmol/L (ref 135–145)
Total Bilirubin: 1.2 mg/dL (ref 0.3–1.2)
Total Protein: 6.5 g/dL (ref 6.5–8.1)

## 2018-03-27 LAB — CBC
HCT: 27.9 % — ABNORMAL LOW (ref 35.0–47.0)
HEMOGLOBIN: 9.2 g/dL — AB (ref 12.0–16.0)
MCH: 25 pg — ABNORMAL LOW (ref 26.0–34.0)
MCHC: 33 g/dL (ref 32.0–36.0)
MCV: 75.6 fL — ABNORMAL LOW (ref 80.0–100.0)
Platelets: 231 10*3/uL (ref 150–440)
RBC: 3.69 MIL/uL — AB (ref 3.80–5.20)
RDW: 16.6 % — ABNORMAL HIGH (ref 11.5–14.5)
WBC: 3.3 10*3/uL — ABNORMAL LOW (ref 3.6–11.0)

## 2018-03-27 LAB — MAGNESIUM: Magnesium: 1.9 mg/dL (ref 1.7–2.4)

## 2018-03-27 LAB — SURGICAL PATHOLOGY

## 2018-03-27 LAB — PHOSPHORUS: Phosphorus: 3.4 mg/dL (ref 2.5–4.6)

## 2018-03-27 MED ORDER — IOHEXOL 300 MG/ML  SOLN
75.0000 mL | Freq: Once | INTRAMUSCULAR | Status: AC | PRN
  Administered 2018-03-27: 75 mL via INTRAVENOUS

## 2018-03-27 MED ORDER — ALUM & MAG HYDROXIDE-SIMETH 200-200-20 MG/5ML PO SUSP
30.0000 mL | Freq: Four times a day (QID) | ORAL | Status: DC | PRN
  Administered 2018-03-29: 30 mL via ORAL
  Filled 2018-03-27: qty 30

## 2018-03-27 MED ORDER — FAMOTIDINE IN NACL 20-0.9 MG/50ML-% IV SOLN
20.0000 mg | Freq: Two times a day (BID) | INTRAVENOUS | Status: DC
  Administered 2018-03-27 – 2018-04-11 (×31): 20 mg via INTRAVENOUS
  Filled 2018-03-27 (×31): qty 50

## 2018-03-27 MED ORDER — OXYCODONE HCL 5 MG PO TABS
5.0000 mg | ORAL_TABLET | Freq: Four times a day (QID) | ORAL | Status: DC | PRN
  Administered 2018-04-06 – 2018-04-13 (×13): 5 mg via ORAL
  Filled 2018-03-27 (×14): qty 1

## 2018-03-27 MED ORDER — CLINIMIX/DEXTROSE (5/15) 5 % IV SOLN
INTRAVENOUS | Status: AC
  Administered 2018-03-27: 22:00:00 via INTRAVENOUS
  Filled 2018-03-27: qty 960

## 2018-03-27 MED ORDER — MORPHINE SULFATE (PF) 2 MG/ML IV SOLN
2.0000 mg | INTRAVENOUS | Status: DC | PRN
  Administered 2018-03-27 – 2018-04-07 (×16): 2 mg via INTRAVENOUS
  Filled 2018-03-27 (×16): qty 1

## 2018-03-27 MED ORDER — INSULIN ASPART 100 UNIT/ML ~~LOC~~ SOLN
0.0000 [IU] | SUBCUTANEOUS | Status: DC
  Administered 2018-03-28: 1 [IU] via SUBCUTANEOUS
  Filled 2018-03-27: qty 1

## 2018-03-27 NOTE — Progress Notes (Signed)
Advanced care plan.  Purpose of the Encounter: CODE STATUS  Parties in Attendance: Patient herself  Patient's Decision Capacity: Intact  Subjective/Patient's story: Patient is 62 year old with history of rheumatoid arthritis, connective tissue disorder who is admitted with weight loss noted to have colonic stricture   Objective/Medical story Discussed with the patient regarding her desires to for cardiac and pulmonary resuscitation   Goals of care determination: Patient wishes to be a full code    CODE STATUS: Full code   Time spent discussing advanced care planning: 16 minutes

## 2018-03-27 NOTE — Progress Notes (Signed)
PICC line has been placed. Dietician also paged for TPN orders.

## 2018-03-27 NOTE — Progress Notes (Signed)
[employer] - [place] [facility]                                                                                                                                                                                  Patient Demographics   Morgan Reid, is a 62 y.o. female, DOB - Jan 16, 1956, AST:[id number]  Admit date - 03/25/2018   Admitting Physician Houston Siren, MD  Outpatient Primary MD for the patient is System, Pcp Not In   LOS - 1  Subjective: Patient continues to have some abdominal pain describes it as heartburn  Review of Systems:   CONSTITUTIONAL: No documented fever. No fatigue, weakness. No weight gain, no weight loss.  EYES: No blurry or double vision.  ENT: No tinnitus. No postnasal drip. No redness of the oropharynx.  RESPIRATORY: No cough, no wheeze, no hemoptysis. No dyspnea.  CARDIOVASCULAR: No chest pain. No orthopnea. No palpitations. No syncope.  GASTROINTESTINAL: No nausea, no vomiting or diarrhea.  Positive abdominal pain. No melena or hematochezia.  GENITOURINARY: No dysuria or hematuria.  ENDOCRINE: No polyuria or nocturia. No heat or cold intolerance.  HEMATOLOGY: No anemia. No bruising. No bleeding.  INTEGUMENTARY: No rashes. No lesions.  MUSCULOSKELETAL: No arthritis. No swelling. No gout.  NEUROLOGIC: No numbness, tingling, or ataxia. No seizure-type activity.  PSYCHIATRIC: No anxiety. No insomnia. No ADD.    Vitals:   Vitals:   03/26/18 1509 03/26/18 1519 03/26/18 1923 03/27/18 0411  BP: 130/82 (!) 129/91 116/76 121/67  Pulse: 97 81 85 83  Resp: 16 16 20 20   Temp:   99.5 F (37.5 C) 98.2 F (36.8 C)  TempSrc:   Oral Oral  SpO2:  100% 100% 100%  Weight:      Height:        Wt Readings from Last 3 Encounters:  03/26/18 44.5 kg  02/02/18 52.2 kg  12/17/15 59.2 kg     Intake/Output Summary (Last 24 hours) at 03/27/2018 1155 Last data filed at 03/27/2018 0500 Gross per 24 hour  Intake 796.86 ml  Output 200 ml  Net 596.86  ml    Physical Exam:   GENERAL: Pleasant-appearing in no apparent distress.  HEAD, EYES, EARS, NOSE AND THROAT: Atraumatic, normocephalic. Extraocular muscles are intact. Pupils equal and reactive to light. Sclerae anicteric. No conjunctival injection. No oro-pharyngeal erythema.  NECK: Supple. There is no jugular venous distention. No bruits, no lymphadenopathy, no thyromegaly.  HEART: Regular rate and rhythm,. No murmurs, no rubs, no clicks.  LUNGS: Clear to auscultation bilaterally. No rales or rhonchi. No wheezes.  ABDOMEN: Soft, flat, nontender, nondistended. Has good bowel sounds. No hepatosplenomegaly appreciated.  EXTREMITIES: No evidence of  any cyanosis, clubbing, or peripheral edema.  +2 pedal and radial pulses bilaterally.  NEUROLOGIC: The patient is alert, awake, and oriented x3 with no focal motor or sensory deficits appreciated bilaterally.  SKIN: Moist and warm with no rashes appreciated.  Psych: Not anxious, depressed LN: No inguinal LN enlargement    Antibiotics   Anti-infectives (From admission, onward)   Start     Dose/Rate Route Frequency Ordered Stop   03/25/18 1615  hydroxychloroquine (PLAQUENIL) tablet 200 mg     200 mg Oral Every other day 03/25/18 1611        Medications   Scheduled Meds: . hydroxychloroquine  200 mg Oral QODAY  . methylPREDNISolone  4 mg Oral QPC breakfast  . ondansetron (ZOFRAN) IV  4 mg Intravenous Q6H  . polyethylene glycol-electrolytes  4,000 mL Oral UD   Continuous Infusions: . sodium chloride Stopped (03/26/18 2220)  . famotidine (PEPCID) IV 20 mg (03/27/18 1129)   PRN Meds:.sodium chloride, acetaminophen **OR** acetaminophen, alum & mag hydroxide-simeth, morphine injection, oxyCODONE   Data Review:   Micro Results No results found for this or any previous visit (from the past 240 hour(s)).  Radiology Reports Ct Abdomen Pelvis W Contrast  Result Date: 03/27/2018 CLINICAL DATA:  62 year old female with generalized  abdominal pain, nausea, vomiting and unintentional weight loss of approximately 20 pounds over the past month. Patient underwent colonoscopy yesterday. EXAM: CT ABDOMEN AND PELVIS WITH CONTRAST TECHNIQUE: Multidetector CT imaging of the abdomen and pelvis was performed using the standard protocol following bolus administration of intravenous contrast. CONTRAST:  68mL OMNIPAQUE IOHEXOL 300 MG/ML  SOLN COMPARISON:  Most recent prior CT scan of the abdomen and pelvis 02/03/2018 and 06/17/2017 FINDINGS: Lower chest: No acute abnormality. Hepatobiliary: Geographic hypoattenuation in the left hemi-liver adjacent to the fissure for the falciform ligament is nonspecific but most suggestive of benign focal fatty infiltration. Normal hepatic contour and morphology. No discrete hepatic lesions. Normal appearance of the gallbladder. No intra or extrahepatic biliary ductal dilatation. Pancreas: Unremarkable. No pancreatic ductal dilatation or surrounding inflammatory changes. Spleen: Normal in size without focal abnormality. Adrenals/Urinary Tract: Normal adrenal glands. No evidence of hydronephrosis or nephrolithiasis. Small circumscribed subcentimeter low-attenuation lesions in both kidneys are too small for accurate characterization but statistically highly likely to represent benign cysts. Unremarkable ureters and bladder. Stomach/Bowel: The stomach and duodenum are unremarkable. Normal appendix in the right lower quadrant. Progressive distension of the distal transverse and descending colon by stool. Inflammatory changes are noted in the pericolonic fat adjacent to the distal descending colon in the left lower quadrant. The dilated segment tapers abruptly to narrow in the sigmoid colon were there is a moderate diverticular disease and apparent wall thickening circumferentially. The remaining sigmoid colon and rectum are decompressed. Vascular/Lymphatic: No significant vascular findings are present. No enlarged abdominal or  pelvic lymph nodes. Reproductive: Uterus and bilateral adnexa are unremarkable. Other: No abdominal wall hernia or abnormality. No abdominopelvic ascites. Musculoskeletal: No acute fracture or aggressive appearing lytic or blastic osseous lesion. IMPRESSION: 1. Progressive stool-filled and distended distal transverse colon and descending colon with abrupt tapering to normal caliber in a region of sigmoid diverticulosis and circumferential wall thickening. The imaging appearance is most suggestive of at least partial colonic obstruction. The underlying obstructive lesion may be secondary to an inflammatory stricture, or neoplasm. Of note, the patient underwent colonoscopy yesterday and multiple biopsies were obtained in this region. No evidence of perforation or abscess. There is mild inflammatory strange in the pericolonic fat. 2. Geographic  low-attenuation in the left hemi liver adjacent to the fissure for the falciform ligament is nonspecific but most suggestive of focal fatty infiltration. However, this focus has enlarged slightly compared to prior imaging from 06/17/2017. If the patient is ultimately diagnosed with a colonic neoplasm, metastatic disease would be difficult to exclude radiographically and further evaluation with MRI or PET-CT may become warranted. Electronically Signed   By: Malachy Moan M.D.   On: 03/27/2018 10:50   Dg Abd Acute W/chest  Result Date: 03/25/2018 CLINICAL DATA:  Lower abdominal pain. EXAM: DG ABDOMEN ACUTE W/ 1V CHEST COMPARISON:  CT scan and radiographs of February 03, 2018. FINDINGS: There is no evidence of dilated bowel loops or free intraperitoneal air. Moderate amount of stool seen throughout the colon. Phleboliths are noted in the pelvis. Heart size and mediastinal contours are within normal limits. Both lungs are clear. IMPRESSION: Moderate stool burden. No evidence of bowel obstruction or ileus. No acute cardiopulmonary disease. Electronically Signed   By: Lupita Raider, M.D.   On: 03/25/2018 12:38     CBC Recent Labs  Lab 03/25/18 1635 03/27/18 0348  WBC 3.7 3.3*  HGB 9.0* 9.2*  HCT 27.8* 27.9*  PLT 236 231  MCV 75.0* 75.6*  MCH 24.2* 25.0*  MCHC 32.3 33.0  RDW 16.4* 16.6*    Chemistries  Recent Labs  Lab 03/25/18 1635 03/27/18 0348  NA 140 135  K 3.3* 3.7  CL 106 104  CO2 26 20*  GLUCOSE 67* 46*  BUN 9 <5*  CREATININE 0.50 0.51  CALCIUM 8.4* 8.2*  AST  --  12*  ALT  --  7  ALKPHOS  --  60  BILITOT  --  1.2   ------------------------------------------------------------------------------------------------------------------ estimated creatinine clearance is 51.2 mL/min (by C-G formula based on SCr of 0.51 mg/dL). ------------------------------------------------------------------------------------------------------------------ No results for input(s): HGBA1C in the last 72 hours. ------------------------------------------------------------------------------------------------------------------ No results for input(s): CHOL, HDL, LDLCALC, TRIG, CHOLHDL, LDLDIRECT in the last 72 hours. ------------------------------------------------------------------------------------------------------------------ No results for input(s): TSH, T4TOTAL, T3FREE, THYROIDAB in the last 72 hours.  Invalid input(s): FREET3 ------------------------------------------------------------------------------------------------------------------ No results for input(s): VITAMINB12, FOLATE, FERRITIN, TIBC, IRON, RETICCTPCT in the last 72 hours.  Coagulation profile No results for input(s): INR, PROTIME in the last 168 hours.  No results for input(s): DDIMER in the last 72 hours.  Cardiac Enzymes No results for input(s): CKMB, TROPONINI, MYOGLOBIN in the last 168 hours.  Invalid input(s): CK ------------------------------------------------------------------------------------------------------------------ Invalid input(s): POCBNP    Assessment & Plan    62 year old female with past medical history of rheumatoid arthritis/connective tissue disease, osteoarthritis, colon mass who presents to the hospital from direct admission from the endoscopy suite.  1.    Colonic stenosis discussed with surgery plan for TPN Surgery later this week I will place patient on H2 blocker Pain control PRN Maalox Full liquid diet  2.  Nausea/vomiting secondary to the colonoscopy prep.  As well as colonic stricture -Supportive care with clear liquid diet, antiemetics.  Vance diet as tolerated  3.  History of rheumatoid arthritis/connective tissue disease-continue maintenance methylprednisolone, Plaquenil.     Code Status Orders  (From admission, onward)         Start     Ordered   03/25/18 1529  Full code  Continuous     03/25/18 1530        Code Status History    This patient has a current code status but no historical code status.  Consults gastroenterology   DVT Prophylaxis  Lovenox   Lab Results  Component Value Date   PLT 231 03/27/2018     Time Spent in minutes    Greater than 50% of time spent in care coordination and counseling patient regarding the condition and plan of care.   Auburn Bilberry M.D on 03/27/2018 at 11:55 AM  Between 7am to 6pm - Pager - [phone]  After 6pm go to [email or web address] - Social research officer, government  [employer]   Office  [phone]

## 2018-03-27 NOTE — Progress Notes (Signed)
Morgan Reid , MD [address], [place], [place], [address] [address], [address], [place], [place], [address] Phone: [phone]  Fax: [phone]   Morgan Reid is being followed for sigmoid mass/o bstruction.  Subjective: No complaints passing gas and watery stool.   Objective: Vital signs in last 24 hours: Vitals:   03/26/18 1509 03/26/18 1519 03/26/18 1923 03/27/18 0411  BP: 130/82 (!) 129/91 116/76 121/67  Pulse: 97 81 85 83  Resp: 16 16 20 20   Temp:   99.5 F (37.5 C) 98.2 F (36.8 C)  TempSrc:   Oral Oral  SpO2:  100% 100% 100%  Weight:      Height:       Weight change: -1.361 kg  Intake/Output Summary (Last 24 hours) at 03/27/2018 03/29/2018 Last data filed at 03/27/2018 0500 Gross per 24 hour  Intake 796.86 ml  Output 200 ml  Net 596.86 ml     Exam: Heart:: Regular rate and rhythm, S1S2 present or without murmur or extra heart sounds Lungs: normal, clear to auscultation and clear to auscultation and percussion Abdomen: soft, nontender, normal bowel sounds   Lab Results: @LABTEST2 @ Micro Results: No results found for this or any previous visit (from the past 240 hour(s)). Studies/Results: Dg Abd Acute W/chest  Result Date: 03/25/2018 CLINICAL DATA:  Lower abdominal pain. EXAM: DG ABDOMEN ACUTE W/ 1V CHEST COMPARISON:  CT scan and radiographs of February 03, 2018. FINDINGS: There is no evidence of dilated bowel loops or free intraperitoneal air. Moderate amount of stool seen throughout the colon. Phleboliths are noted in the pelvis. Heart size and mediastinal contours are within normal limits. Both lungs are clear. IMPRESSION: Moderate stool burden. No evidence of bowel obstruction or ileus. No acute cardiopulmonary disease. Electronically Signed   By: 03/27/2018, M.D.   On: 03/25/2018 12:38   Medications: I have reviewed the patient's current medications. Scheduled Meds: . hydroxychloroquine  200 mg Oral QODAY  . methylPREDNISolone  4  mg Oral QPC breakfast  . ondansetron (ZOFRAN) IV  4 mg Intravenous Q6H  . polyethylene glycol-electrolytes  4,000 mL Oral UD   Continuous Infusions: . sodium chloride Stopped (03/26/18 2220)   PRN Meds:.sodium chloride, acetaminophen **OR** acetaminophen, iohexol, ketorolac   Assessment: Active Problems:   Colonic mass   Morgan Reid 62 y.o. female is a patient of Dr. Raquel Sarna who performed a sigmoidoscopy yesterday and found the significant narrowing of the sigmoid colon that he could not "passed through".  Looking back in the records evaluation began more than 6 months back, there was microcytic anemia even back in May 2017.  He spoke to me last evening and informed me that the differentials he was considering was a mass of the sigmoid colon from malignancy versus diverticulitis versus inflammatory bowel disease.  There is no clear evidence of inflammatory bowel disease in the past.  Patient has been seen by Dr. Marva Panda and I agree with his recommendation that irrespective of the etiology since there is significant luminal narrowing ,sooner rather than later is going to lead to obstruction and surgery would be the best option to make the complete diagnosis as well as relieve the obstructive obstruction.  Recommendations by surgery I  believe the next step would be to obtain CT scans and rule out any lesions outside the lumen  of the colon if it were to be a neoplasm.  Follow-up results of the biopsy.  I note that there is an inflammatory bowel  disease panel which is pending, results of which will not change management at this point of time, rather the surgical specimen would be more indicative in case this is a stricture from crohns disease.  From the GI point, I do note that the patient has a long standing microcytic anemia and will probably benefit from checking the iron studies B12 folate and if low need  replacement   I have no further recommendations.  I will sign out, please call  with any further questions.    LOS: 1 day   Morgan Mood, MD 03/27/2018, 9:24 AM

## 2018-03-27 NOTE — Consult Note (Signed)
PHARMACY - ADULT TOTAL PARENTERAL NUTRITION CONSULT NOTE   Pharmacy Consult for TPN Initiation and Management  Indication: Severe malnutrition  Patient Measurements: Height: 5\' 4"  (162.6 cm) Weight: 96 lb 12.5 oz (43.9 kg) IBW/kg (Calculated) : 54.7 TPN AdjBW (KG): 44.5 Body mass index is 16.61 kg/m.  Assessment: Pharmacy to initiate TPN in 62 yo female with severe malnutrition in need of bowel surgery with anastomosis. Patient has had significant weight loss (20-30lbs over a few months) due to sigmoid mass, abdominal pain and possible partial obstruction due to the sigmoid stricture.  GI:  Endo: No Hx of DM  Insulin requirements in the past 24 hours: 0 Lytes: Mg: 1.9 Renal: Scr: 0.51, CrCl: 50.5 Hepatobil:  TPN Access: 8/31 @ 1900 TPN start date: 8/31 Nutritional Goals (per RD recommendation): F/U w/ RD recommendation on 9/1  kCal: Protein:  Fluid:  Goal TPN rate is: F/U w/RD on 9/1   Current Nutrition: None  Plan:  8/31 TPN ordered to start after hours. Clinimix 5/15 without electrolytes started at a rate of 33mL/hr. Electrolyte currently WNL.   F/U on when to Add MVI and trace elements to TPN  Q4H CBGs and SSI  ordered and will be adjust as needed  Monitor TPN labs per protocol.   F/U 9/1 with goals and recommendations from RD,    11/1, PharmD, BCPS Clinical Pharmacist 03/27/2018 10:25 PM

## 2018-03-27 NOTE — Progress Notes (Signed)
SURGICAL PROGRESS NOTE   Hospital Day(s): 1.   Post op day(s): 1 Day Post-Op.   Interval History: Patient seen and examined, no acute events or new complaints overnight. Patient reports epigastric pain from "bile gastritis". Pain on epigastric area. Pain does not radiates. Pain aggravated with water intake. Pain improves with aleve. Denies nausea at this moment but had some nausea without vomiting yesterday.   Vital signs in last 24 hours: [min-max] current  Temp:  [97.1 F (36.2 C)-99.5 F (37.5 C)] 98.2 F (36.8 C) (08/31 0411) Pulse Rate:  [81-99] 83 (08/31 0411) Resp:  [16-20] 20 (08/31 0411) BP: (116-130)/(66-91) 121/67 (08/31 0411) SpO2:  [99 %-100 %] 100 % (08/31 0411) Weight:  [44.5 kg] 44.5 kg (08/30 1336)     Height: 5\' 4"  (162.6 cm) Weight: 44.5 kg BMI (Calculated): 16.81    Physical Exam:  Constitutional: alert, cooperative and no distress  Respiratory: breathing non-labored at rest  Cardiovascular: regular rate and sinus rhythm  Gastrointestinal: soft, non-tender, and non-distended  Labs:  CBC Latest Ref Rng & Units 03/27/2018 03/25/2018 02/02/2018  WBC 3.6 - 11.0 K/uL 3.3(L) 3.7 7.5  Hemoglobin 12.0 - 16.0 g/dL 04/05/2018) 7.2(I) 11.4(L)  Hematocrit 35.0 - 47.0 % 27.9(L) 27.8(L) 35.1  Platelets 150 - 440 K/uL 231 236 318   CMP Latest Ref Rng & Units 03/27/2018 03/25/2018 02/02/2018  Glucose 70 - 99 mg/dL 04/05/2018) 55(H) 74(B)  BUN 8 - 23 mg/dL 638(G) 9 8  Creatinine <5(X - 1.00 mg/dL 6.46 8.03 2.12  Sodium 135 - 145 mmol/L 135 140 139  Potassium 3.5 - 5.1 mmol/L 3.7 3.3(L) 3.1(L)  Chloride 98 - 111 mmol/L 104 106 104  CO2 22 - 32 mmol/L 20(L) 26 23  Calcium 8.9 - 10.3 mg/dL 8.2(L) 8.4(L) 9.3  Total Protein 6.5 - 8.1 g/dL 6.5 - 8.6(H)  Total Bilirubin 0.3 - 1.2 mg/dL 1.2 - 0.8  Alkaline Phos 38 - 126 U/L 60 - 95  AST 15 - 41 U/L 12(L) - 19  ALT 0 - 44 U/L 7 - 7    Imaging studies:  CT scan of abdomen and pelvis   Assessment/Plan:  62 y.o. female with sigmoid  stricture of unknown etiology (inflammatory bowel disease, vs diverticular stricture, vs mass).  - I ordered a CT scan with IV and oral contrast for evaluation of proximal colon dilation and liver evaluation (in case this is malignant). This is needed for surgical planning.  - Discussion with patient and daughter was taken. Patient and daughter oriented about colonic stricture. Detailed explanation of the area of stricture and different causes of the stricture. Surgical alternatives were explained including sigmoid resection with end colostomy vs sigmoid resection with anastomosis and protective loop ileostomy. Patient oriented that due to severe malnutrition, chronic steroid use and discrepancy in size of the proximal and distal large intestine, just a primary anastomosis will be too risky and at least a protective loop ileostomy will be needed.   Malnutrition: patient with significant weight loss due to abdominal pain and possible partial obstruction due to the sigmoid stricture. I recommend to start patient on TPN if surgery will be done.   I agree to continue workup by GI and Internal medicine of the etiology of the colonic mass. Inflammatory markers in process. Biopsy in process. This also will help with the surgical planning and decision making.   This was a more than 60 minutes encounter and most of the time orienting the patient and the daughter about the patient's  condition and plan of care (pre surgery, surgical alternative and post op care)  Gae Gallop, MD

## 2018-03-27 NOTE — Progress Notes (Signed)
[facility] was contacted to set up PICC line placement. They will call back to provide estimated time of arrival.

## 2018-03-27 NOTE — Progress Notes (Signed)
PICC Line Insertion Procedure Note  Procedure: Insertion of #14F PICC  Indications:  TPN  Procedure Details  Informed consent was obtained for the procedure, including sedation.  Risks of lung perforation, hemorrhage, and adverse drug reaction were discussed.   Maximum sterile technique was used including antiseptics, cap, gloves, gown, hand hygiene, mask and sheet.  #14F PICC inserted to the R brachial vein per hospital protocol.   Blood return:  yes  Findings: Catheter inserted to 37 cm, with 0 cm. Exposed. Mid upper arm circumference is 23 cm.  There were no changes to vital signs. Catheter was flushed with 20 cc NS. Patient did tolerate procedure well.  Recommendations: PICC Brochure given to patient with teaching instruction.  ECG and vessel image on paper chart.

## 2018-03-28 DIAGNOSIS — E43 Unspecified severe protein-calorie malnutrition: Secondary | ICD-10-CM

## 2018-03-28 LAB — DIFFERENTIAL
BASOS ABS: 0 10*3/uL (ref 0–0.1)
Basophils Relative: 0 %
Eosinophils Absolute: 0.1 10*3/uL (ref 0–0.7)
Eosinophils Relative: 3 %
LYMPHS ABS: 1.3 10*3/uL (ref 1.0–3.6)
Lymphocytes Relative: 42 %
MONO ABS: 0.3 10*3/uL (ref 0.2–0.9)
Monocytes Relative: 10 %
NEUTROS ABS: 1.3 10*3/uL — AB (ref 1.4–6.5)
NEUTROS PCT: 45 %

## 2018-03-28 LAB — GLUCOSE, CAPILLARY
GLUCOSE-CAPILLARY: 108 mg/dL — AB (ref 70–99)
GLUCOSE-CAPILLARY: 118 mg/dL — AB (ref 70–99)
GLUCOSE-CAPILLARY: 126 mg/dL — AB (ref 70–99)
Glucose-Capillary: 113 mg/dL — ABNORMAL HIGH (ref 70–99)
Glucose-Capillary: 126 mg/dL — ABNORMAL HIGH (ref 70–99)
Glucose-Capillary: 107 mg/dL — ABNORMAL HIGH (ref 70–99)

## 2018-03-28 LAB — COMPREHENSIVE METABOLIC PANEL
ALBUMIN: 2.3 g/dL — AB (ref 3.5–5.0)
ALT: 8 U/L (ref 0–44)
ANION GAP: 5 (ref 5–15)
AST: 13 U/L — ABNORMAL LOW (ref 15–41)
Alkaline Phosphatase: 56 U/L (ref 38–126)
BILIRUBIN TOTAL: 0.4 mg/dL (ref 0.3–1.2)
BUN: <5 mg/dL — ABNORMAL LOW (ref 8–23)
CALCIUM: 8.3 mg/dL — AB (ref 8.9–10.3)
CO2: 28 mmol/L (ref 22–32)
Chloride: 103 mmol/L (ref 98–111)
Creatinine, Ser: 0.47 mg/dL (ref 0.44–1.00)
Glucose, Bld: 115 mg/dL — ABNORMAL HIGH (ref 70–99)
Potassium: 3 mmol/L — ABNORMAL LOW (ref 3.5–5.1)
Sodium: 136 mmol/L (ref 135–145)
Total Protein: 6.5 g/dL (ref 6.5–8.1)

## 2018-03-28 LAB — MAGNESIUM: MAGNESIUM: 1.8 mg/dL (ref 1.7–2.4)

## 2018-03-28 LAB — IRON AND TIBC
Iron: 35 ug/dL (ref 28–170)
SATURATION RATIOS: 20 % (ref 10.4–31.8)
TIBC: 177 ug/dL — ABNORMAL LOW (ref 250–450)
UIBC: 142 ug/dL

## 2018-03-28 LAB — CBC
HCT: 26.7 % — ABNORMAL LOW (ref 35.0–47.0)
Hemoglobin: 8.9 g/dL — ABNORMAL LOW (ref 12.0–16.0)
MCH: 24.5 pg — AB (ref 26.0–34.0)
MCHC: 33.2 g/dL (ref 32.0–36.0)
MCV: 73.6 fL — ABNORMAL LOW (ref 80.0–100.0)
PLATELETS: 234 10*3/uL (ref 150–440)
RBC: 3.62 MIL/uL — ABNORMAL LOW (ref 3.80–5.20)
RDW: 16.3 % — AB (ref 11.5–14.5)
WBC: 3 10*3/uL — ABNORMAL LOW (ref 3.6–11.0)

## 2018-03-28 LAB — PHOSPHORUS: PHOSPHORUS: 2.7 mg/dL (ref 2.5–4.6)

## 2018-03-28 LAB — FOLATE: FOLATE: 8 ng/mL (ref >5.9)

## 2018-03-28 LAB — FERRITIN: Ferritin: 98 ng/mL (ref 11–307)

## 2018-03-28 LAB — PREALBUMIN: PREALBUMIN: 5.1 mg/dL — AB (ref 18–38)

## 2018-03-28 LAB — TRIGLYCERIDES: TRIGLYCERIDES: 72 mg/dL (ref <150)

## 2018-03-28 LAB — RETICULOCYTES
RBC.: 4.11 MIL/uL (ref 3.80–5.20)
RETIC CT PCT: 1.2 % (ref 0.4–3.1)
Retic Count, Absolute: 49.3 10*3/uL (ref 19.0–183.0)

## 2018-03-28 LAB — POTASSIUM: Potassium: 3.4 mmol/L — ABNORMAL LOW (ref 3.5–5.1)

## 2018-03-28 LAB — VITAMIN B12: Vitamin B-12: 2750 pg/mL — ABNORMAL HIGH (ref 180–914)

## 2018-03-28 MED ORDER — POTASSIUM CHLORIDE 10 MEQ/100ML IV SOLN
10.0000 meq | INTRAVENOUS | Status: AC
  Administered 2018-03-29 (×2): 10 meq via INTRAVENOUS
  Filled 2018-03-28 (×2): qty 100

## 2018-03-28 MED ORDER — POLYETHYLENE GLYCOL 3350 17 GM/SCOOP PO POWD
1.0000 | Freq: Once | ORAL | Status: AC
  Administered 2018-03-28: 255 g via ORAL
  Filled 2018-03-28: qty 255

## 2018-03-28 MED ORDER — TRACE MINERALS CR-CU-MN-SE-ZN 10-1000-500-60 MCG/ML IV SOLN
INTRAVENOUS | Status: AC
  Administered 2018-03-28: 19:00:00 via INTRAVENOUS
  Filled 2018-03-28: qty 1440

## 2018-03-28 MED ORDER — PREMIER PROTEIN SHAKE
2.0000 [oz_av] | Freq: Four times a day (QID) | ORAL | Status: DC
  Administered 2018-03-28 – 2018-03-30 (×4): 2 [oz_av] via ORAL

## 2018-03-28 MED ORDER — FAT EMULSION PLANT BASED 20 % IV EMUL
180.0000 mL | INTRAVENOUS | Status: AC
  Administered 2018-03-28 – 2018-03-29 (×2): 180 mL via INTRAVENOUS
  Filled 2018-03-28: qty 180

## 2018-03-28 MED ORDER — POTASSIUM CHLORIDE 10 MEQ/100ML IV SOLN
10.0000 meq | INTRAVENOUS | Status: AC
  Administered 2018-03-28 (×5): 10 meq via INTRAVENOUS
  Filled 2018-03-28 (×5): qty 100

## 2018-03-28 NOTE — Consult Note (Signed)
PHARMACY - ADULT TOTAL PARENTERAL NUTRITION CONSULT NOTE   Pharmacy Consult for TPN Initiation and Management  Indication: Severe malnutrition  Patient Measurements: Height: 5\' 4"  (162.6 cm) Weight: 96 lb 12.5 oz (43.9 kg) IBW/kg (Calculated) : 54.7 TPN AdjBW (KG): 44.5 Body mass index is 16.61 kg/m.  Assessment: POD 2 sigmoidoscopy, which found signficant narrowing of sigmoid colon. Pharmacy was consulted to initiate TPN on POD 1 in 62 yo female with severe malnutrition in need of bowel surgery with anastomosis. Patient has had significant weight loss (20-30lbs over a few months) due to sigmoid mass, abdominal pain and possible partial obstruction due to the sigmoid stricture.  GI: GI recommends iron studies due to history of microcytic anemia, says inflammatory bowel disease panel is pending (which will not change course - a stricture biopsy/surgical specimen would me more indicative of crohn's disease). Endo: No Hx of DM  Insulin requirements in the past 24 hours: 0 SSI sensitive Q4H ordered per protocol Lytes: K 3 (receiving 6 runs of potassium chloride 10 mEq IV), Mg: 1.8, phos 2.7, Ca 8.3 Renal: Scr: 0.47 mg/dL, CrCl: 68 mL/min Hepatobil:  TPN Access: 8/31 @ 1900 TPN start date: 8/31 Nutritional Goals (per RD recommendation):  KCal: 1382 Protein: 72 g Fluid: 1620 mL  Goal TPN rate is: 60 mL/hr    Current Nutrition: TPN (at goal)  Plan: Advance to goal Clinimix E5/15 at 60 mL/hr with ILE 20% 15 ml/hr x 12 hours/24 hours.   Add MVI, trace elements to TPN daily. Add thiamine 100 mg to TPN daily x 3 days (9/1, 9/2, 9/3)  Q4H CBGs and SSI  ordered and will be adjust as needed  Monitor TPN labs per protocol.   Replete potassium with KCl 10 mEq IV Q2H x 6 doses. Will recheck labs tomorrow per protocol.   02-03-1976, PharmD, BCPS Clinical Pharmacist 03/28/2018 8:27 AM

## 2018-03-28 NOTE — Consult Note (Signed)
PHARMACY - ADULT TOTAL PARENTERAL NUTRITION CONSULT NOTE   Pharmacy Consult for TPN Initiation and Management  Indication: Severe malnutrition  Patient Measurements: Height: 5\' 4"  (162.6 cm) Weight: 96 lb 12.5 oz (43.9 kg) IBW/kg (Calculated) : 54.7 TPN AdjBW (KG): 44.5 Body mass index is 16.61 kg/m.  Assessment: POD 2 sigmoidoscopy, which found signficant narrowing of sigmoid colon. Pharmacy was consulted to initiate TPN on POD 1 in 62 yo female with severe malnutrition in need of bowel surgery with anastomosis. Patient has had significant weight loss (20-30lbs over a few months) due to sigmoid mass, abdominal pain and possible partial obstruction due to the sigmoid stricture.  GI: GI recommends iron studies due to history of microcytic anemia, says inflammatory bowel disease panel is pending (which will not change course - a stricture biopsy/surgical specimen would me more indicative of crohn's disease). Endo: No Hx of DM  Insulin requirements in the past 24 hours: 0 SSI sensitive Q4H ordered per protocol Lytes: K 3 (receiving 6 runs of potassium chloride 10 mEq IV), Mg: 1.8, phos 2.7, Ca 8.3 Renal: Scr: 0.47 mg/dL, CrCl: 68 mL/min Hepatobil:  TPN Access: 8/31 @ 1900 TPN start date: 8/31 Nutritional Goals (per RD recommendation):  KCal: 1382 Protein: 72 g Fluid: 1620 mL  Goal TPN rate is: 60 mL/hr    Current Nutrition: TPN (at goal)  Plan: Advance to goal Clinimix E5/15 at 60 mL/hr with ILE 20% 15 ml/hr x 12 hours/24 hours.   Add MVI, trace elements to TPN daily. Add thiamine 100 mg to TPN daily x 3 days (9/1, 9/2, 9/3)  Q4H CBGs and SSI  ordered and will be adjust as needed  Monitor TPN labs per protocol.   9/1 @ 2224 K: 3.4. Will replete potassium with KCl 10 mEq IV x 2 doses. Will recheck labs tomorrow per protocol.   2225, PharmD, BCPS Clinical Pharmacist 03/28/2018 10:53 PM

## 2018-03-28 NOTE — Progress Notes (Addendum)
Initial Nutrition Assessment  DOCUMENTATION CODES:   Severe malnutrition in context of acute illness/injury  INTERVENTION:   Recommend check iron/anemia labs including iron, transferrin, TIBC, and ferritin.  Change to Clinimix 5/15 with electrolytes at goal rate of 60 ml/hr + 20% lipids @15ml /hr x 12 hrs/day    Regimen provides 1382kcal/day, 72g/day protein, 1656m volume    Add MVI daily   Add trace elements daily    Add IV thiamine 1053mdaily x 3 days   Pt at high refeeding risk; recommend check P, K, and Mg daily until labs stabilize   Daily weights   NUTRITION DIAGNOSIS:   Severe Malnutrition related to acute illness as evidenced by 22% percent weight loss in <5 months, mild to moderate fat depletions, severe muscle depletions.  GOAL:   Patient will meet greater than or equal to 90% of their needs  MONITOR:   Diet advancement, Labs, Weight trends, Skin, I & O's, Other (Comment)(TPN)  REASON FOR ASSESSMENT:   Consult New TPN/TNA  ASSESSMENT:   [age].o. female with a known history of collagen vascular disease, rheumatoid arthritis, chronic anemia who presents with sigmoid stricture of unknown etiology   Met with pt in room today. Pt reports decreased appetite and oral intake for the past 3 months r/t abdominal pain and nausea. Pt reports intermittent vomiting unrelated to food intake; reports she will sometimes vomit up bile when she hasn't even eating anything. Pt also reports vomiting after drinking just water. Pt did purchase and started drinking Ensure but was unable to keep that down. Pt reports her UBW is ~126lbs. Per chart, pt has lost 27lbs(22%) since March; this is severe weight loss. Pt is eating about 50% of her clear liquid diet; pt drank some juice and coffee this morning and so far has tolerated without nausea or vomiting. Pt reports that she has not had any vomiting in 2 days. Pt started on TPN yesterday; will increase to goal today. Pt would like to have  vanilla or strawberry Ensure once diet is able to be advanced. Of note, pt with microcytic anemia; would recommend check iron/anemia labs including iron, transferrin, TIBC, and ferritin.   Medications reviewed and include: insulin, medrol, zofran, pepcid, KCl  Labs reviewed: K 3.0(L), BUN <5(L), Ca 8.3(L), P 2.7 wnl, Mg 1.8 wnl Prealbumin- 5.1(L) Triglycerides- 72 Wbc- 3.0(L), Hgb 8.9(L), Hct 26.7(L), MCV 73.6(L), MCH 73.6(L), MCH 24.5(L)  NUTRITION - FOCUSED PHYSICAL EXAM:    Most Recent Value  Orbital Region  No depletion  Upper Arm Region  No depletion  Thoracic and Lumbar Region  Moderate depletion  Buccal Region  Moderate depletion  Temple Region  Moderate depletion  Clavicle Bone Region  Severe depletion  Clavicle and Acromion Bone Region  Severe depletion  Scapular Bone Region  Moderate depletion  Dorsal Hand  Moderate depletion  Patellar Region  Severe depletion  Anterior Thigh Region  Severe depletion  Posterior Calf Region  Severe depletion  Edema (RD Assessment)  None  Hair  Reviewed  Eyes  Reviewed  Mouth  Reviewed  Skin  Reviewed  Nails  Reviewed     Diet Order:   Diet Order            Diet clear liquid Room service appropriate? Yes; Fluid consistency: Thin  Diet effective now             EDUCATION NEEDS:   Education needs have been addressed  Skin:  Skin Assessment: Reviewed RN Assessment(ecchymosis )  Last BM:  8/31- type 7  Height:   Ht Readings from Last 1 Encounters:  03/26/18 5' 4"  (1.626 m)    Weight:   Wt Readings from Last 1 Encounters:  03/27/18 43.9 kg    Ideal Body Weight:  54.5 kg  BMI:  Body mass index is 16.61 kg/m.  Estimated Nutritional Needs:   Kcal:  1300-1500kcal/day   Protein:  70-78g/day   Fluid:  >1.3L/day   Koleen Distance MS, RD, LDN Pager #- [phone] Office#- [phone] After Hours Pager: [phone]

## 2018-03-28 NOTE — Progress Notes (Signed)
[facility] - [place] [facility]                                                                                                                                                                                  Patient Demographics   Morgan Reid, is a 62 y.o. female, DOB - 1956-03-23, JKD:[id number]  Admit date - 03/25/2018   Admitting Physician Houston Siren, MD  Outpatient Primary MD for the patient is System, Pcp Not In   LOS - 2  Subjective: Denies any complaints states that she does not like clear liquid diet  Review of Systems:   CONSTITUTIONAL: No documented fever. No fatigue, weakness. No weight gain, no weight loss.  EYES: No blurry or double vision.  ENT: No tinnitus. No postnasal drip. No redness of the oropharynx.  RESPIRATORY: No cough, no wheeze, no hemoptysis. No dyspnea.  CARDIOVASCULAR: No chest pain. No orthopnea. No palpitations. No syncope.  GASTROINTESTINAL: No nausea, no vomiting or diarrhea.  Positive abdominal pain. No melena or hematochezia.  GENITOURINARY: No dysuria or hematuria.  ENDOCRINE: No polyuria or nocturia. No heat or cold intolerance.  HEMATOLOGY: No anemia. No bruising. No bleeding.  INTEGUMENTARY: No rashes. No lesions.  MUSCULOSKELETAL: No arthritis. No swelling. No gout.  NEUROLOGIC: No numbness, tingling, or ataxia. No seizure-type activity.  PSYCHIATRIC: No anxiety. No insomnia. No ADD.    Vitals:   Vitals:   03/27/18 1232 03/27/18 1908 03/27/18 1954 03/28/18 0425  BP: 104/64  112/77 110/71  Pulse: 87  85 79  Resp: 16  16 18   Temp: 98.7 F (37.1 C)  99.2 F (37.3 C) 98.7 F (37.1 C)  TempSrc: Oral  Oral Oral  SpO2: 99%  100% 100%  Weight:  43.9 kg    Height:        Wt Readings from Last 3 Encounters:  03/27/18 43.9 kg  02/02/18 52.2 kg  12/17/15 59.2 kg     Intake/Output Summary (Last 24 hours) at 03/28/2018 1157 Last data filed at 03/28/2018 1028 Gross per 24 hour  Intake 847 ml  Output -  Net 847 ml     Physical Exam:   GENERAL: Pleasant-appearing in no apparent distress.  HEAD, EYES, EARS, NOSE AND THROAT: Atraumatic, normocephalic. Extraocular muscles are intact. Pupils equal and reactive to light. Sclerae anicteric. No conjunctival injection. No oro-pharyngeal erythema.  NECK: Supple. There is no jugular venous distention. No bruits, no lymphadenopathy, no thyromegaly.  HEART: Regular rate and rhythm,. No murmurs, no rubs, no clicks.  LUNGS: Clear to auscultation bilaterally. No rales or rhonchi. No wheezes.  ABDOMEN: Soft, flat, nontender, nondistended. Has good bowel sounds. No hepatosplenomegaly appreciated.  EXTREMITIES:  No evidence of any cyanosis, clubbing, or peripheral edema.  +2 pedal and radial pulses bilaterally.  NEUROLOGIC: The patient is alert, awake, and oriented x3 with no focal motor or sensory deficits appreciated bilaterally.  SKIN: Moist and warm with no rashes appreciated.  Psych: Not anxious, depressed LN: No inguinal LN enlargement    Antibiotics   Anti-infectives (From admission, onward)   Start     Dose/Rate Route Frequency Ordered Stop   03/25/18 1615  hydroxychloroquine (PLAQUENIL) tablet 200 mg     200 mg Oral Every other day 03/25/18 1611        Medications   Scheduled Meds: . hydroxychloroquine  200 mg Oral QODAY  . insulin aspart  0-9 Units Subcutaneous Q4H  . methylPREDNISolone  4 mg Oral QPC breakfast  . ondansetron (ZOFRAN) IV  4 mg Intravenous Q6H  . polyethylene glycol-electrolytes  4,000 mL Oral UD   Continuous Infusions: . sodium chloride Stopped (03/26/18 2220)  . famotidine (PEPCID) IV 20 mg (03/28/18 1105)  . Marland KitchenTPN (CLINIMIX-E) Adult     And  . Fat emulsion    . potassium chloride 10 mEq (03/28/18 0937)  . TPN (CLINIMIX) Adult without lytes 40 mL/hr at 03/28/18 0517   PRN Meds:.sodium chloride, acetaminophen **OR** acetaminophen, alum & mag hydroxide-simeth, morphine injection, oxyCODONE   Data Review:   Micro  Results No results found for this or any previous visit (from the past 240 hour(s)).  Radiology Reports Ct Abdomen Pelvis W Contrast  Result Date: 03/27/2018 CLINICAL DATA:  62 year old female with generalized abdominal pain, nausea, vomiting and unintentional weight loss of approximately 20 pounds over the past month. Patient underwent colonoscopy yesterday. EXAM: CT ABDOMEN AND PELVIS WITH CONTRAST TECHNIQUE: Multidetector CT imaging of the abdomen and pelvis was performed using the standard protocol following bolus administration of intravenous contrast. CONTRAST:  66mL OMNIPAQUE IOHEXOL 300 MG/ML  SOLN COMPARISON:  Most recent prior CT scan of the abdomen and pelvis 02/03/2018 and 06/17/2017 FINDINGS: Lower chest: No acute abnormality. Hepatobiliary: Geographic hypoattenuation in the left hemi-liver adjacent to the fissure for the falciform ligament is nonspecific but most suggestive of benign focal fatty infiltration. Normal hepatic contour and morphology. No discrete hepatic lesions. Normal appearance of the gallbladder. No intra or extrahepatic biliary ductal dilatation. Pancreas: Unremarkable. No pancreatic ductal dilatation or surrounding inflammatory changes. Spleen: Normal in size without focal abnormality. Adrenals/Urinary Tract: Normal adrenal glands. No evidence of hydronephrosis or nephrolithiasis. Small circumscribed subcentimeter low-attenuation lesions in both kidneys are too small for accurate characterization but statistically highly likely to represent benign cysts. Unremarkable ureters and bladder. Stomach/Bowel: The stomach and duodenum are unremarkable. Normal appendix in the right lower quadrant. Progressive distension of the distal transverse and descending colon by stool. Inflammatory changes are noted in the pericolonic fat adjacent to the distal descending colon in the left lower quadrant. The dilated segment tapers abruptly to narrow in the sigmoid colon were there is a moderate  diverticular disease and apparent wall thickening circumferentially. The remaining sigmoid colon and rectum are decompressed. Vascular/Lymphatic: No significant vascular findings are present. No enlarged abdominal or pelvic lymph nodes. Reproductive: Uterus and bilateral adnexa are unremarkable. Other: No abdominal wall hernia or abnormality. No abdominopelvic ascites. Musculoskeletal: No acute fracture or aggressive appearing lytic or blastic osseous lesion. IMPRESSION: 1. Progressive stool-filled and distended distal transverse colon and descending colon with abrupt tapering to normal caliber in a region of sigmoid diverticulosis and circumferential wall thickening. The imaging appearance is most suggestive of at least  partial colonic obstruction. The underlying obstructive lesion may be secondary to an inflammatory stricture, or neoplasm. Of note, the patient underwent colonoscopy yesterday and multiple biopsies were obtained in this region. No evidence of perforation or abscess. There is mild inflammatory strange in the pericolonic fat. 2. Geographic low-attenuation in the left hemi liver adjacent to the fissure for the falciform ligament is nonspecific but most suggestive of focal fatty infiltration. However, this focus has enlarged slightly compared to prior imaging from 06/17/2017. If the patient is ultimately diagnosed with a colonic neoplasm, metastatic disease would be difficult to exclude radiographically and further evaluation with MRI or PET-CT may become warranted. Electronically Signed   By: Malachy Moan M.D.   On: 03/27/2018 10:50   Dg Abd Acute W/chest  Result Date: 03/25/2018 CLINICAL DATA:  Lower abdominal pain. EXAM: DG ABDOMEN ACUTE W/ 1V CHEST COMPARISON:  CT scan and radiographs of February 03, 2018. FINDINGS: There is no evidence of dilated bowel loops or free intraperitoneal air. Moderate amount of stool seen throughout the colon. Phleboliths are noted in the pelvis. Heart size and  mediastinal contours are within normal limits. Both lungs are clear. IMPRESSION: Moderate stool burden. No evidence of bowel obstruction or ileus. No acute cardiopulmonary disease. Electronically Signed   By: Lupita Raider, M.D.   On: 03/25/2018 12:38   [place] Ekg Site Rite  Result Date: 03/27/2018 If Site Rite image not attached, placement could not be confirmed due to current cardiac rhythm.    CBC Recent Labs  Lab 03/25/18 1635 03/27/18 0348 03/28/18 0525  WBC 3.7 3.3* 3.0*  HGB 9.0* 9.2* 8.9*  HCT 27.8* 27.9* 26.7*  PLT 236 231 234  MCV 75.0* 75.6* 73.6*  MCH 24.2* 25.0* 24.5*  MCHC 32.3 33.0 33.2  RDW 16.4* 16.6* 16.3*  LYMPHSABS  --   --  1.3  MONOABS  --   --  0.3  EOSABS  --   --  0.1  BASOSABS  --   --  0.0    Chemistries  Recent Labs  Lab 03/25/18 1635 03/27/18 0348 03/28/18 0525  NA 140 135 136  K 3.3* 3.7 3.0*  CL 106 104 103  CO2 26 20* 28  GLUCOSE 67* 46* 115*  BUN 9 <5* <5*  CREATININE 0.50 0.51 0.47  CALCIUM 8.4* 8.2* 8.3*  MG  --  1.9 1.8  AST  --  12* 13*  ALT  --  7 8  ALKPHOS  --  60 56  BILITOT  --  1.2 0.4   ------------------------------------------------------------------------------------------------------------------ estimated creatinine clearance is 50.5 mL/min (by C-G formula based on SCr of 0.47 mg/dL). ------------------------------------------------------------------------------------------------------------------ No results for input(s): HGBA1C in the last 72 hours. ------------------------------------------------------------------------------------------------------------------ Recent Labs    03/28/18 0525  TRIG 72   ------------------------------------------------------------------------------------------------------------------ No results for input(s): TSH, T4TOTAL, T3FREE, THYROIDAB in the last 72 hours.  Invalid input(s):  FREET3 ------------------------------------------------------------------------------------------------------------------ Recent Labs    03/28/18 1054  RETICCTPCT 1.2    Coagulation profile No results for input(s): INR, PROTIME in the last 168 hours.  No results for input(s): DDIMER in the last 72 hours.  Cardiac Enzymes No results for input(s): CKMB, TROPONINI, MYOGLOBIN in the last 168 hours.  Invalid input(s): CK ------------------------------------------------------------------------------------------------------------------ Invalid input(s): POCBNP    Assessment & Plan   62 year old female with past medical history of rheumatoid arthritis/connective tissue disease, osteoarthritis, colon mass who presents to the hospital from direct admission from the endoscopy suite.  1.    Colonic stenosis  tpn  for now  Surgery later this week Continue H2 blocker Pain control PRN Maalox Patient's diet converted back to clear liquids per surgery  2.  Hypokalemia pharmacy has been consulted replace potassium  3.  Anemia microcytic in nature likely has iron deficiency we will check iron may need IV iron   4.  History of rheumatoid arthritis/connective tissue disease-continue maintenance methylprednisolone, Plaquenil.     Code Status Orders  (From admission, onward)         Start     Ordered   03/25/18 1529  Full code  Continuous     03/25/18 1530        Code Status History    This patient has a current code status but no historical code status.           Consults gastroenterology   DVT Prophylaxis  Lovenox   Lab Results  Component Value Date   PLT 234 03/28/2018     Time Spent in minutes    Greater than 50% of time spent in care coordination and counseling patient regarding the condition and plan of care.   Auburn Bilberry M.D on 03/28/2018 at 11:57 AM  Between 7am to 6pm - Pager - [phone]  After 6pm go to [email or web address] - Air traffic controller  [facility]   Office  [phone]

## 2018-03-28 NOTE — Progress Notes (Signed)
SURGICAL PROGRESS NOTE   Hospital Day(s): 2.   Interval History: Patient seen and examined, no acute events or new complaints overnight. Patient reports no change since last visit. Denies nausea today.   Vital signs in last 24 hours: [min-max] current  Temp:  [98.7 F (37.1 C)-99.2 F (37.3 C)] 98.7 F (37.1 C) (09/01 0425) Pulse Rate:  [79-87] 79 (09/01 0425) Resp:  [16-18] 18 (09/01 0425) BP: (104-112)/(64-77) 110/71 (09/01 0425) SpO2:  [99 %-100 %] 100 % (09/01 0425) Weight:  [43.9 kg] 43.9 kg (08/31 1908)     Height: 5\' 4"  (162.6 cm) Weight: 43.9 kg BMI (Calculated): 16.6   Physical Exam:  Constitutional: alert, cooperative and no distress  Respiratory: breathing non-labored at rest  Cardiovascular: regular rate and sinus rhythm  Gastrointestinal: soft, non-tender, and non-distended  Labs:  CBC Latest Ref Rng & Units 03/28/2018 03/27/2018 03/25/2018  WBC 3.6 - 11.0 K/uL 3.0(L) 3.3(L) 3.7  Hemoglobin 12.0 - 16.0 g/dL 03/27/2018) 0.2(O) 3.7(C)  Hematocrit 35.0 - 47.0 % 26.7(L) 27.9(L) 27.8(L)  Platelets 150 - 440 K/uL 234 231 236   CMP Latest Ref Rng & Units 03/28/2018 03/27/2018 03/25/2018  Glucose 70 - 99 mg/dL 03/27/2018) 502(D) 74(J)  BUN 8 - 23 mg/dL 28(N) <8(M) 9  Creatinine 0.44 - 1.00 mg/dL <7(E 7.20 9.47  Sodium 135 - 145 mmol/L 136 135 140  Potassium 3.5 - 5.1 mmol/L 3.0(L) 3.7 3.3(L)  Chloride 98 - 111 mmol/L 103 104 106  CO2 22 - 32 mmol/L 28 20(L) 26  Calcium 8.9 - 10.3 mg/dL 8.3(L) 8.2(L) 8.4(L)  Total Protein 6.5 - 8.1 g/dL 6.5 6.5 -  Total Bilirubin 0.3 - 1.2 mg/dL 0.4 1.2 -  Alkaline Phos 38 - 126 U/L 56 60 -  AST 15 - 41 U/L 13(L) 12(L) -  ALT 0 - 44 U/L 8 7 -    Imaging studies:  EXAM: CT ABDOMEN AND PELVIS WITH CONTRAST  TECHNIQUE: Multidetector CT imaging of the abdomen and pelvis was performed using the standard protocol following bolus administration of intravenous contrast.  CONTRAST:  37mL OMNIPAQUE IOHEXOL 300 MG/ML  SOLN  COMPARISON:  Most  recent prior CT scan of the abdomen and pelvis 02/03/2018 and 06/17/2017  FINDINGS: Lower chest: No acute abnormality.  Hepatobiliary: Geographic hypoattenuation in the left hemi-liver adjacent to the fissure for the falciform ligament is nonspecific but most suggestive of benign focal fatty infiltration. Normal hepatic contour and morphology. No discrete hepatic lesions. Normal appearance of the gallbladder. No intra or extrahepatic biliary ductal dilatation.  Pancreas: Unremarkable. No pancreatic ductal dilatation or surrounding inflammatory changes.  Spleen: Normal in size without focal abnormality.  Adrenals/Urinary Tract: Normal adrenal glands. No evidence of hydronephrosis or nephrolithiasis. Small circumscribed subcentimeter low-attenuation lesions in both kidneys are too small for accurate characterization but statistically highly likely to represent benign cysts. Unremarkable ureters and bladder.  Stomach/Bowel: The stomach and duodenum are unremarkable. Normal appendix in the right lower quadrant. Progressive distension of the distal transverse and descending colon by stool. Inflammatory changes are noted in the pericolonic fat adjacent to the distal descending colon in the left lower quadrant. The dilated segment tapers abruptly to narrow in the sigmoid colon were there is a moderate diverticular disease and apparent wall thickening circumferentially. The remaining sigmoid colon and rectum are decompressed.  Vascular/Lymphatic: No significant vascular findings are present. No enlarged abdominal or pelvic lymph nodes.  Reproductive: Uterus and bilateral adnexa are unremarkable.  Other: No abdominal wall hernia or abnormality. No abdominopelvic  ascites.  Musculoskeletal: No acute fracture or aggressive appearing lytic or blastic osseous lesion.  IMPRESSION: 1. Progressive stool-filled and distended distal transverse colon and descending colon with  abrupt tapering to normal caliber in a region of sigmoid diverticulosis and circumferential wall thickening. The imaging appearance is most suggestive of at least partial colonic obstruction. The underlying obstructive lesion may be secondary to an inflammatory stricture, or neoplasm. Of note, the patient underwent colonoscopy yesterday and multiple biopsies were obtained in this region. No evidence of perforation or abscess. There is mild inflammatory strange in the pericolonic fat. 2. Geographic low-attenuation in the left hemi liver adjacent to the fissure for the falciform ligament is nonspecific but most suggestive of focal fatty infiltration. However, this focus has enlarged slightly compared to prior imaging from 06/17/2017. If the patient is ultimately diagnosed with a colonic neoplasm, metastatic disease would be difficult to exclude radiographically and further evaluation with MRI or PET-CT may become warranted.   Electronically Signed   By: Malachy Moan M.D.   On: 03/27/2018 10:50  Assessment/Plan:  62 y.o. female with sigmoid stricture of unknown etiology (inflammatory bowel disease, vs diverticular stricture, vs mass).  - CT scan show a dilation proximal to the sigmoid stricture. Still unknown etiology. Inflammatory bowel disease marker and colonoscopy biopsy still in process.   - Irrespective of the results patient will need surgery because the stricture is causing significant proximal dilation.  - Patient with malnutrition that still need to be optimized for surgical management. Patient started on TPN and protein supplement.  - Will keep in clear liquids to decrease the amount of stool produced. Will give bowel prep in lower dosed for a longer time to decrease the amount of stool load and try to decrease the proximal colon dilation to decrease the discrepancy of proximal and distal colon size for better anastomosis.   Malnutrition: TPN started will follow  nutritional parameters to decide best time for surgery. Surgery date not set yet, patient on optimization process.   Gae Gallop, MD

## 2018-03-29 LAB — GLUCOSE, CAPILLARY
GLUCOSE-CAPILLARY: 105 mg/dL — AB (ref 70–99)
GLUCOSE-CAPILLARY: 113 mg/dL — AB (ref 70–99)
Glucose-Capillary: 107 mg/dL — ABNORMAL HIGH (ref 70–99)
Glucose-Capillary: 108 mg/dL — ABNORMAL HIGH (ref 70–99)
Glucose-Capillary: 115 mg/dL — ABNORMAL HIGH (ref 70–99)
Glucose-Capillary: 98 mg/dL (ref 70–99)

## 2018-03-29 LAB — COMPREHENSIVE METABOLIC PANEL
ALBUMIN: 2.3 g/dL — AB (ref 3.5–5.0)
ALK PHOS: 54 U/L (ref 38–126)
ALT: 7 U/L (ref 0–44)
AST: 14 U/L — AB (ref 15–41)
Anion gap: 6 (ref 5–15)
BUN: 7 mg/dL — AB (ref 8–23)
CO2: 27 mmol/L (ref 22–32)
CREATININE: 0.4 mg/dL — AB (ref 0.44–1.00)
Calcium: 8.4 mg/dL — ABNORMAL LOW (ref 8.9–10.3)
Chloride: 105 mmol/L (ref 98–111)
GFR calc Af Amer: >60 mL/min (ref >60)
GFR calc non Af Amer: >60 mL/min (ref >60)
GLUCOSE: 102 mg/dL — AB (ref 70–99)
POTASSIUM: 4 mmol/L (ref 3.5–5.1)
Sodium: 138 mmol/L (ref 135–145)
Total Bilirubin: 0.2 mg/dL — ABNORMAL LOW (ref 0.3–1.2)
Total Protein: 6.4 g/dL — ABNORMAL LOW (ref 6.5–8.1)

## 2018-03-29 LAB — CBC
HCT: 26.6 % — ABNORMAL LOW (ref 35.0–47.0)
Hemoglobin: 8.9 g/dL — ABNORMAL LOW (ref 12.0–16.0)
MCH: 24.8 pg — AB (ref 26.0–34.0)
MCHC: 33.3 g/dL (ref 32.0–36.0)
MCV: 74.3 fL — AB (ref 80.0–100.0)
PLATELETS: 219 10*3/uL (ref 150–440)
RBC: 3.58 MIL/uL — AB (ref 3.80–5.20)
RDW: 16.5 % — ABNORMAL HIGH (ref 11.5–14.5)
WBC: 2.9 10*3/uL — ABNORMAL LOW (ref 3.6–11.0)

## 2018-03-29 LAB — DIFFERENTIAL
BASOS PCT: 0 %
Basophils Absolute: 0 10*3/uL (ref 0–0.1)
Eosinophils Absolute: 0.1 10*3/uL (ref 0–0.7)
Eosinophils Relative: 3 %
LYMPHS PCT: 42 %
Lymphs Abs: 1.2 10*3/uL (ref 1.0–3.6)
MONO ABS: 0.2 10*3/uL (ref 0.2–0.9)
Monocytes Relative: 8 %
Neutro Abs: 1.4 10*3/uL (ref 1.4–6.5)
Neutrophils Relative %: 47 %

## 2018-03-29 LAB — PREALBUMIN: PREALBUMIN: 5.8 mg/dL — AB (ref 18–38)

## 2018-03-29 LAB — MAGNESIUM: Magnesium: 1.9 mg/dL (ref 1.7–2.4)

## 2018-03-29 LAB — PHOSPHORUS: Phosphorus: 3 mg/dL (ref 2.5–4.6)

## 2018-03-29 LAB — TRIGLYCERIDES: Triglycerides: 76 mg/dL (ref <150)

## 2018-03-29 MED ORDER — SODIUM CHLORIDE 0.9% FLUSH
10.0000 mL | INTRAVENOUS | Status: DC | PRN
  Administered 2018-04-01: 10 mL
  Filled 2018-03-29: qty 40

## 2018-03-29 MED ORDER — SODIUM CHLORIDE 0.9% FLUSH
10.0000 mL | Freq: Two times a day (BID) | INTRAVENOUS | Status: DC
  Administered 2018-03-29: 10 mL
  Administered 2018-03-30: 20 mL
  Administered 2018-03-30 – 2018-03-31 (×2): 10 mL
  Administered 2018-03-31: 20 mL
  Administered 2018-04-01 – 2018-04-03 (×5): 10 mL
  Administered 2018-04-03 – 2018-04-04 (×2): 20 mL
  Administered 2018-04-04 – 2018-04-08 (×8): 10 mL
  Administered 2018-04-09: 20 mL
  Administered 2018-04-09 – 2018-04-13 (×9): 10 mL

## 2018-03-29 MED ORDER — FAT EMULSION PLANT BASED 20 % IV EMUL
180.0000 mL | INTRAVENOUS | Status: AC
  Administered 2018-03-29: 180 mL via INTRAVENOUS
  Filled 2018-03-29: qty 180

## 2018-03-29 MED ORDER — SODIUM CHLORIDE 0.9 % IV SOLN
200.0000 mg | Freq: Once | INTRAVENOUS | Status: AC
  Administered 2018-03-29: 200 mg via INTRAVENOUS
  Filled 2018-03-29: qty 10

## 2018-03-29 MED ORDER — TRACE MINERALS CR-CU-MN-SE-ZN 10-1000-500-60 MCG/ML IV SOLN
INTRAVENOUS | Status: AC
  Administered 2018-03-29: 17:00:00 via INTRAVENOUS
  Filled 2018-03-29: qty 1440

## 2018-03-29 NOTE — Consult Note (Signed)
PHARMACY - ADULT TOTAL PARENTERAL NUTRITION CONSULT NOTE   Pharmacy Consult for TPN Initiation and Management  Indication: Severe malnutrition  Patient Measurements: Height: 5\' 4"  (162.6 cm) Weight: 99 lb 14.4 oz (45.3 kg) IBW/kg (Calculated) : 54.7 TPN AdjBW (KG): 44.5 Body mass index is 17.15 kg/m.  Assessment: POD 2 sigmoidoscopy, which found signficant narrowing of sigmoid colon. Pharmacy was consulted to initiate TPN on POD 1 in 62 yo female with severe malnutrition in need of bowel surgery with anastomosis. Patient has had significant weight loss (20-30lbs over a few months) due to sigmoid mass, abdominal pain and possible partial obstruction due to the sigmoid stricture.  GI: GI recommends iron studies due to history of microcytic anemia, says inflammatory bowel disease panel is pending (which will not change course - a stricture biopsy/surgical specimen would me more indicative of crohn's disease). Endo: No Hx of DM  Insulin requirements in the past 24 hours: 1 SSI sensitive Q4H ordered per protocol Lytes: K 4.0, Mg: 1.9, phos 3.0, Ca 8.4  Albumin 2.3 Renal: Scr: 0.40 mg/dL, CrCl: 68 mL/min Hepatobil:  TPN Access: 8/31 @ 1900 TPN start date: 8/31 Nutritional Goals (per RD recommendation):  KCal: 1382 Protein: 72 g Fluid: 1620 mL  Goal TPN rate is: 60 mL/hr    Current Nutrition: TPN (at goal)  Plan: At goal rate Clinimix E 5/15 at 60 mL/hr with ILE 20% 15 ml/hr x 12 hours/24 hours.   Add MVI, trace elements to TPN daily. Add thiamine 100 mg to TPN daily x 3 days (9/1, 9/2, 9/3)  Q4H CBGs and SSI  ordered and will be adjust as needed. (Consider adjust to q6h?- not really requiring insulin so far) On clear liquids- per surgery will not advance this because needs surgery.  Monitor TPN labs per protocol.    11/3, PharmD, BCPS Clinical Pharmacist 03/29/2018 10:10 AM

## 2018-03-29 NOTE — Progress Notes (Signed)
SURGICAL PROGRESS NOTE   Hospital Day(s): 3.   Post op day(s): 3 Days Post-Op.   Interval History: Patient seen and examined, no acute events or new complaints overnight. Patient reports epigastric pain and some nausea. Pain on epigastric area does not radiates. Pain aggravated with oral intake.   Vital signs in last 24 hours: [min-max] current  Temp:  [98.6 F (37 C)-99.2 F (37.3 C)] 99 F (37.2 C) (09/02 1215) Pulse Rate:  [87-92] 89 (09/02 1215) Resp:  [16-18] 16 (09/02 1215) BP: (97-104)/(64-72) 104/72 (09/02 1215) SpO2:  [100 %] 100 % (09/02 1215) Weight:  [45.3 kg] 45.3 kg (09/02 0106)     Height: 5\' 4"  (162.6 cm) Weight: 45.3 kg BMI (Calculated): 17.14     Physical Exam:  Constitutional: alert, cooperative and no distress  Respiratory: breathing non-labored at rest  Cardiovascular: regular rate and sinus rhythm  Gastrointestinal: soft, non-tender, and non-distended  Labs:  CBC Latest Ref Rng & Units 03/29/2018 03/28/2018 03/27/2018  WBC 3.6 - 11.0 K/uL 2.9(L) 3.0(L) 3.3(L)  Hemoglobin 12.0 - 16.0 g/dL 03/29/2018) 8.9(L) 9.2(L)  Hematocrit 35.0 - 47.0 % 26.6(L) 26.7(L) 27.9(L)  Platelets 150 - 440 K/uL 219 234 231   CMP Latest Ref Rng & Units 03/29/2018 03/28/2018 03/28/2018  Glucose 70 - 99 mg/dL 05/28/2018) - 875(I)  BUN 8 - 23 mg/dL 7(L) - 433(I)  Creatinine 0.44 - 1.00 mg/dL <9(J) - 1.88(C  Sodium 135 - 145 mmol/L 138 - 136  Potassium 3.5 - 5.1 mmol/L 4.0 3.4(L) 3.0(L)  Chloride 98 - 111 mmol/L 105 - 103  CO2 22 - 32 mmol/L 27 - 28  Calcium 8.9 - 10.3 mg/dL 1.66) - 8.3(L)  Total Protein 6.5 - 8.1 g/dL 6.4(L) - 6.5  Total Bilirubin 0.3 - 1.2 mg/dL 0.6(T) - 0.4  Alkaline Phos 38 - 126 U/L 54 - 56  AST 15 - 41 U/L 14(L) - 13(L)  ALT 0 - 44 U/L 7 - 8    Imaging studies: No new pertinent imaging studies   Assessment/Plan:  62 y.o.femalewith sigmoid stricture of unknown etiology (inflammatory bowel disease, vs diverticular stricture, vs mass). Patient evaluated today and  found with epigastric pain. Patient with difficulty tolerating clear liquids and protein shakes. Patient started on TPN. Will try Maalox for epigastric, burning pain.  More questions about surgery answered.   Malnutrition: TPN started will follow nutritional parameters to decide best time for surgery. Pre albumin 5.8.   68, MD

## 2018-03-29 NOTE — Progress Notes (Addendum)
[employer] - [place] at [facility]                                                                                                                                                                                  Patient Demographics   Morgan Reid, is a 62 y.o. female, DOB - [birth year], ZOX:[id number]  Admit date - 03/25/2018   Admitting Physician Houston Siren, MD  Outpatient Primary MD for the patient is System, Pcp Not In   LOS - 3  Subjective: Patient doing well denies any complaints  Review of Systems:   CONSTITUTIONAL: No documented fever. No fatigue, weakness. No weight gain, no weight loss.  EYES: No blurry or double vision.  ENT: No tinnitus. No postnasal drip. No redness of the oropharynx.  RESPIRATORY: No cough, no wheeze, no hemoptysis. No dyspnea.  CARDIOVASCULAR: No chest pain. No orthopnea. No palpitations. No syncope.  GASTROINTESTINAL: No nausea, no vomiting or diarrhea.  No abdominal pain. No melena or hematochezia.  GENITOURINARY: No dysuria or hematuria.  ENDOCRINE: No polyuria or nocturia. No heat or cold intolerance.  HEMATOLOGY: No anemia. No bruising. No bleeding.  INTEGUMENTARY: No rashes. No lesions.  MUSCULOSKELETAL: No arthritis. No swelling. No gout.  NEUROLOGIC: No numbness, tingling, or ataxia. No seizure-type activity.  PSYCHIATRIC: No anxiety. No insomnia. No ADD.    Vitals:   Vitals:   03/28/18 1923 03/29/18 0106 03/29/18 0440 03/29/18 1215  BP: 97/64  100/67 104/72  Pulse: 87  92 89  Resp: 18  16 16   Temp: 99.2 F (37.3 C)  98.6 F (37 C) 99 F (37.2 C)  TempSrc: Oral  Oral Oral  SpO2: 100%  100% 100%  Weight:  45.3 kg    Height:        Wt Readings from Last 3 Encounters:  03/29/18 45.3 kg  02/02/18 52.2 kg  12/17/15 59.2 kg     Intake/Output Summary (Last 24 hours) at 03/29/2018 1251 Last data filed at 03/29/2018 1216 Gross per 24 hour  Intake 2774.73 ml  Output 200 ml  Net 2574.73 ml    Physical Exam:    GENERAL: Pleasant-appearing in no apparent distress.  HEAD, EYES, EARS, NOSE AND THROAT: Atraumatic, normocephalic. Extraocular muscles are intact. Pupils equal and reactive to light. Sclerae anicteric. No conjunctival injection. No oro-pharyngeal erythema.  NECK: Supple. There is no jugular venous distention. No bruits, no lymphadenopathy, no thyromegaly.  HEART: Regular rate and rhythm,. No murmurs, no rubs, no clicks.  LUNGS: Clear to auscultation bilaterally. No rales or rhonchi. No wheezes.  ABDOMEN: Soft, flat, nontender, nondistended. Has good bowel sounds. No hepatosplenomegaly appreciated.  EXTREMITIES: No evidence of any cyanosis,  clubbing, or peripheral edema.  +2 pedal and radial pulses bilaterally.  NEUROLOGIC: The patient is alert, awake, and oriented x3 with no focal motor or sensory deficits appreciated bilaterally.  SKIN: Moist and warm with no rashes appreciated.  Psych: Not anxious, depressed LN: No inguinal LN enlargement    Antibiotics   Anti-infectives (From admission, onward)   Start     Dose/Rate Route Frequency Ordered Stop   03/25/18 1615  hydroxychloroquine (PLAQUENIL) tablet 200 mg     200 mg Oral Every other day 03/25/18 1611        Medications   Scheduled Meds: . hydroxychloroquine  200 mg Oral QODAY  . insulin aspart  0-9 Units Subcutaneous Q4H  . methylPREDNISolone  4 mg Oral QPC breakfast  . ondansetron (ZOFRAN) IV  4 mg Intravenous Q6H  . protein supplement shake  2 oz Oral QID   Continuous Infusions: . sodium chloride Stopped (03/26/18 2220)  . famotidine (PEPCID) IV Stopped (03/29/18 0908)  . Marland KitchenTPN (CLINIMIX-E) Adult     And  . Fat emulsion    . Marland KitchenTPN (CLINIMIX-E) Adult 60 mL/hr at 03/29/18 1216   PRN Meds:.sodium chloride, acetaminophen **OR** acetaminophen, alum & mag hydroxide-simeth, morphine injection, oxyCODONE   Data Review:   Micro Results No results found for this or any previous visit (from the past 240  hour(s)).  Radiology Reports Ct Abdomen Pelvis W Contrast  Result Date: 03/27/2018 CLINICAL DATA:  62 year old female with generalized abdominal pain, nausea, vomiting and unintentional weight loss of approximately 20 pounds over the past month. Patient underwent colonoscopy yesterday. EXAM: CT ABDOMEN AND PELVIS WITH CONTRAST TECHNIQUE: Multidetector CT imaging of the abdomen and pelvis was performed using the standard protocol following bolus administration of intravenous contrast. CONTRAST:  36mL OMNIPAQUE IOHEXOL 300 MG/ML  SOLN COMPARISON:  Most recent prior CT scan of the abdomen and pelvis 02/03/2018 and 06/17/2017 FINDINGS: Lower chest: No acute abnormality. Hepatobiliary: Geographic hypoattenuation in the left hemi-liver adjacent to the fissure for the falciform ligament is nonspecific but most suggestive of benign focal fatty infiltration. Normal hepatic contour and morphology. No discrete hepatic lesions. Normal appearance of the gallbladder. No intra or extrahepatic biliary ductal dilatation. Pancreas: Unremarkable. No pancreatic ductal dilatation or surrounding inflammatory changes. Spleen: Normal in size without focal abnormality. Adrenals/Urinary Tract: Normal adrenal glands. No evidence of hydronephrosis or nephrolithiasis. Small circumscribed subcentimeter low-attenuation lesions in both kidneys are too small for accurate characterization but statistically highly likely to represent benign cysts. Unremarkable ureters and bladder. Stomach/Bowel: The stomach and duodenum are unremarkable. Normal appendix in the right lower quadrant. Progressive distension of the distal transverse and descending colon by stool. Inflammatory changes are noted in the pericolonic fat adjacent to the distal descending colon in the left lower quadrant. The dilated segment tapers abruptly to narrow in the sigmoid colon were there is a moderate diverticular disease and apparent wall thickening circumferentially. The  remaining sigmoid colon and rectum are decompressed. Vascular/Lymphatic: No significant vascular findings are present. No enlarged abdominal or pelvic lymph nodes. Reproductive: Uterus and bilateral adnexa are unremarkable. Other: No abdominal wall hernia or abnormality. No abdominopelvic ascites. Musculoskeletal: No acute fracture or aggressive appearing lytic or blastic osseous lesion. IMPRESSION: 1. Progressive stool-filled and distended distal transverse colon and descending colon with abrupt tapering to normal caliber in a region of sigmoid diverticulosis and circumferential wall thickening. The imaging appearance is most suggestive of at least partial colonic obstruction. The underlying obstructive lesion may be secondary to an inflammatory stricture, or  neoplasm. Of note, the patient underwent colonoscopy yesterday and multiple biopsies were obtained in this region. No evidence of perforation or abscess. There is mild inflammatory strange in the pericolonic fat. 2. Geographic low-attenuation in the left hemi liver adjacent to the fissure for the falciform ligament is nonspecific but most suggestive of focal fatty infiltration. However, this focus has enlarged slightly compared to prior imaging from 06/17/2017. If the patient is ultimately diagnosed with a colonic neoplasm, metastatic disease would be difficult to exclude radiographically and further evaluation with MRI or PET-CT may become warranted. Electronically Signed   By: Malachy Moan M.D.   On: 03/27/2018 10:50   Dg Abd Acute W/chest  Result Date: 03/25/2018 CLINICAL DATA:  Lower abdominal pain. EXAM: DG ABDOMEN ACUTE W/ 1V CHEST COMPARISON:  CT scan and radiographs of February 03, 2018. FINDINGS: There is no evidence of dilated bowel loops or free intraperitoneal air. Moderate amount of stool seen throughout the colon. Phleboliths are noted in the pelvis. Heart size and mediastinal contours are within normal limits. Both lungs are clear.  IMPRESSION: Moderate stool burden. No evidence of bowel obstruction or ileus. No acute cardiopulmonary disease. Electronically Signed   By: Lupita Raider, M.D.   On: 03/25/2018 12:38   [place] Ekg Site Rite  Result Date: 03/27/2018 If Site Rite image not attached, placement could not be confirmed due to current cardiac rhythm.    CBC Recent Labs  Lab 03/25/18 1635 03/27/18 0348 03/28/18 0525 03/29/18 0504  WBC 3.7 3.3* 3.0* 2.9*  HGB 9.0* 9.2* 8.9* 8.9*  HCT 27.8* 27.9* 26.7* 26.6*  PLT 236 231 234 219  MCV 75.0* 75.6* 73.6* 74.3*  MCH 24.2* 25.0* 24.5* 24.8*  MCHC 32.3 33.0 33.2 33.3  RDW 16.4* 16.6* 16.3* 16.5*  LYMPHSABS  --   --  1.3 1.2  MONOABS  --   --  0.3 0.2  EOSABS  --   --  0.1 0.1  BASOSABS  --   --  0.0 0.0    Chemistries  Recent Labs  Lab 03/25/18 1635 03/27/18 0348 03/28/18 0525 03/28/18 2224 03/29/18 0504  NA 140 135 136  --  138  K 3.3* 3.7 3.0* 3.4* 4.0  CL 106 104 103  --  105  CO2 26 20* 28  --  27  GLUCOSE 67* 46* 115*  --  102*  BUN 9 <5* <5*  --  7*  CREATININE 0.50 0.51 0.47  --  0.40*  CALCIUM 8.4* 8.2* 8.3*  --  8.4*  MG  --  1.9 1.8  --  1.9  AST  --  12* 13*  --  14*  ALT  --  7 8  --  7  ALKPHOS  --  60 56  --  54  BILITOT  --  1.2 0.4  --  0.2*   ------------------------------------------------------------------------------------------------------------------ estimated creatinine clearance is 52.1 mL/min (A) (by C-G formula based on SCr of 0.4 mg/dL (L)). ------------------------------------------------------------------------------------------------------------------ No results for input(s): HGBA1C in the last 72 hours. ------------------------------------------------------------------------------------------------------------------ Recent Labs    03/28/18 0525 03/29/18 0504  TRIG 72 76   ------------------------------------------------------------------------------------------------------------------ No results for input(s):  TSH, T4TOTAL, T3FREE, THYROIDAB in the last 72 hours.  Invalid input(s): FREET3 ------------------------------------------------------------------------------------------------------------------ Recent Labs    03/28/18 1054  VITAMINB12 2,750*  FOLATE 8.0  FERRITIN 98  TIBC 177*  IRON 35  RETICCTPCT 1.2    Coagulation profile No results for input(s): INR, PROTIME in the last 168 hours.  No results for  input(s): DDIMER in the last 72 hours.  Cardiac Enzymes No results for input(s): CKMB, TROPONINI, MYOGLOBIN in the last 168 hours.  Invalid input(s): CK ------------------------------------------------------------------------------------------------------------------ Invalid input(s): POCBNP    Assessment & Plan   62 year old female with past medical history of rheumatoid arthritis/connective tissue disease, osteoarthritis, colon mass who presents to the hospital from direct admission from the endoscopy suite.  1.    Colonic stenosis  tpn for now  Surgery later this week Continue H2 blocker Pain control PRN Maalox Patient's diet converted back to clear liquids per surgery  2.  Hypokalemia potassium now replaced  3.  Anemia microcytic in nature patient's iron studies shows ferritin and iron level borderline normal I will give a dose of iron  4.  History of rheumatoid arthritis/connective tissue disease-continue maintenance methylprednisolone, Plaquenil.     Code Status Orders  (From admission, onward)         Start     Ordered   03/25/18 1529  Full code  Continuous     03/25/18 1530        Code Status History    This patient has a current code status but no historical code status.           Consults gastroenterology   DVT Prophylaxis  Lovenox   Lab Results  Component Value Date   PLT 219 03/29/2018     Time Spent in minutes  Greater than 50% of time spent in care coordination and counseling patient regarding the condition and plan of  care.   Auburn Bilberry M.D on 03/29/2018 at 12:51 PM  Between 7am to 6pm - Pager - [phone]  After 6pm go to [email or web address] - Social research officer, government  [employer]   Office  [phone]

## 2018-03-30 ENCOUNTER — Encounter: Payer: Self-pay | Admitting: Gastroenterology

## 2018-03-30 LAB — INFLAMMATORY BOWEL DISEASE-IBD
Atypical P-ANCA titer: <1:20 {titer}
SACCHAROMYCES CEREVISIAE AB: 33.4 U — AB (ref 0.0–24.9)
Saccharomyces cerevisiae, IgA: <20 Units (ref 0.0–24.9)

## 2018-03-30 LAB — BASIC METABOLIC PANEL
Anion gap: 7 (ref 5–15)
BUN: 10 mg/dL (ref 8–23)
CHLORIDE: 102 mmol/L (ref 98–111)
CO2: 28 mmol/L (ref 22–32)
CREATININE: 0.31 mg/dL — AB (ref 0.44–1.00)
Calcium: 8.2 mg/dL — ABNORMAL LOW (ref 8.9–10.3)
GFR calc Af Amer: >60 mL/min (ref >60)
GFR calc non Af Amer: >60 mL/min (ref >60)
GLUCOSE: 96 mg/dL (ref 70–99)
POTASSIUM: 3.6 mmol/L (ref 3.5–5.1)
SODIUM: 137 mmol/L (ref 135–145)

## 2018-03-30 LAB — GLUCOSE, CAPILLARY
GLUCOSE-CAPILLARY: 94 mg/dL (ref 70–99)
Glucose-Capillary: 108 mg/dL — ABNORMAL HIGH (ref 70–99)
Glucose-Capillary: 141 mg/dL — ABNORMAL HIGH (ref 70–99)
Glucose-Capillary: 93 mg/dL (ref 70–99)
Glucose-Capillary: 99 mg/dL (ref 70–99)

## 2018-03-30 MED ORDER — BOOST / RESOURCE BREEZE PO LIQD CUSTOM
1.0000 | Freq: Three times a day (TID) | ORAL | Status: DC
  Administered 2018-03-30 – 2018-04-04 (×11): 1 via ORAL

## 2018-03-30 MED ORDER — INSULIN ASPART 100 UNIT/ML ~~LOC~~ SOLN
0.0000 [IU] | Freq: Four times a day (QID) | SUBCUTANEOUS | Status: DC
  Administered 2018-03-30: 1 [IU] via SUBCUTANEOUS
  Filled 2018-03-30: qty 1

## 2018-03-30 MED ORDER — TRACE MINERALS CR-CU-MN-SE-ZN 10-1000-500-60 MCG/ML IV SOLN
INTRAVENOUS | Status: AC
  Administered 2018-03-30: 17:00:00 via INTRAVENOUS
  Filled 2018-03-30: qty 1440

## 2018-03-30 MED ORDER — FAT EMULSION PLANT BASED 20 % IV EMUL
180.0000 mL | INTRAVENOUS | Status: AC
  Administered 2018-03-30: 180 mL via INTRAVENOUS
  Filled 2018-03-30: qty 180

## 2018-03-30 NOTE — Progress Notes (Signed)
SURGICAL PROGRESS NOTE   Hospital Day(s): 4.   Post op day(s): 4 Days Post-Op.   Interval History: Patient seen and examined, no acute events or new complaints overnight. Patient reports having intermittent pain and nausea. Refers does not like any of the clear liquids, just the coffee.  Vital signs in last 24 hours: [min-max] current  Temp:  [98.4 F (36.9 C)-99 F (37.2 C)] 98.6 F (37 C) (09/03 1247) Pulse Rate:  [94-99] 99 (09/03 1247) Resp:  [14-20] 14 (09/03 1247) BP: (98-103)/(68-75) 100/69 (09/03 1247) SpO2:  [100 %] 100 % (09/03 1247) Weight:  [49.4 kg] 49.4 kg (09/03 0500)     Height: 5\' 4"  (162.6 cm) Weight: 49.4 kg BMI (Calculated): 18.68   Physical Exam:  Constitutional: alert, cooperative and no distress  Respiratory: breathing non-labored at rest  Cardiovascular: regular rate and sinus rhythm  Gastrointestinal: soft, non-tender, and non-distended  Labs:  CBC Latest Ref Rng & Units 03/29/2018 03/28/2018 03/27/2018  WBC 3.6 - 11.0 K/uL 2.9(L) 3.0(L) 3.3(L)  Hemoglobin 12.0 - 16.0 g/dL 03/29/2018) 8.9(L) 9.2(L)  Hematocrit 35.0 - 47.0 % 26.6(L) 26.7(L) 27.9(L)  Platelets 150 - 440 K/uL 219 234 231   CMP Latest Ref Rng & Units 03/30/2018 03/29/2018 03/28/2018  Glucose 70 - 99 mg/dL 96 05/28/2018) -  BUN 8 - 23 mg/dL 10 7(L) -  Creatinine 226(J - 1.00 mg/dL 3.35) 4.56(Y) -  Sodium 135 - 145 mmol/L 137 138 -  Potassium 3.5 - 5.1 mmol/L 3.6 4.0 3.4(L)  Chloride 98 - 111 mmol/L 102 105 -  CO2 22 - 32 mmol/L 28 27 -  Calcium 8.9 - 10.3 mg/dL 8.2(L) 8.4(L) -  Total Protein 6.5 - 8.1 g/dL - 6.4(L) -  Total Bilirubin 0.3 - 1.2 mg/dL - 5.63(S) -  Alkaline Phos 38 - 126 U/L - 54 -  AST 15 - 41 U/L - 14(L) -  ALT 0 - 44 U/L - 7 -    Imaging studies: No new pertinent imaging studies   Assessment/Plan:  63 y.o.femalewith sigmoid stricture of unknown etiology (inflammatory bowel disease, vs diverticular stricture, vs mass). - Inflammatory bowel disease markers in proceed -  Colonoscopy biopsy pending - Patient continue to complain of pain and nausea, not tolerating diet. - Severe Malnutrition:Pre Albumin in 5.8. With this albumin patient can not undergo bowel surgery and anastomosis. Needs at least a week of TPN and supplements to improve nutrition enough to have the best surgery possible with deceased risk of complication. (sigmoid colectomy with anastomosis with protective loop ileostomy).  Will continue to follow and assess progress of nutritional status  68, MD

## 2018-03-30 NOTE — Consult Note (Signed)
PHARMACY - ADULT TOTAL PARENTERAL NUTRITION CONSULT NOTE   Pharmacy Consult for TPN Initiation and Management  Indication: Severe malnutrition  Patient Measurements: Height: 5\' 4"  (162.6 cm) Weight: 108 lb 14.5 oz (49.4 kg) IBW/kg (Calculated) : 54.7 TPN AdjBW (KG): 44.5 Body mass index is 18.69 kg/m.  Assessment: 61 yo female s/p sigmoidoscopy, which found signficant narrowing of sigmoid colon. Pharmacy was consulted to initiate TPN on POD 1 in 62 yo female with severe malnutrition in need of bowel surgery with anastomosis. Patient has had significant weight loss (20-30lbs over a few months) due to sigmoid mass, abdominal pain and possible partial obstruction due to the sigmoid stricture.  GI: GI recommends iron studies due to history of microcytic anemia, says inflammatory bowel disease panel is pending (which will not change course - a stricture biopsy/surgical specimen would me more indicative of crohn's disease). Endo: No Hx of DM  Insulin requirements in the past 24 hours: 0 unit SSI sensitive Q4H ordered per protocol Lytes: K 3.6, Ca 8.2, CorrCa 9.6 9/2: Mg: 1.9, phos 3.0, Albumin 2.3 Renal: Scr: 0.31 mg/dL, CrCl: 57 mL/min Hepatobil:  TPN Access: 8/31 @ 1900 TPN start date: 8/31 Nutritional Goals (per RD recommendation):  KCal: 1382 Protein: 72 g Fluid: 1620 mL  Goal TPN rate is: 60 mL/hr    Current Nutrition: TPN (at goal); On clear liquids- per surgery will not advance this because needs surgery.   Plan:  Continue at goal rate Clinimix E 5/15 at 60 mL/hr with ILE 20% 15 ml/hr x 12 hours.   Add MVI, trace elements to TPN daily. Add thiamine 100 mg to TPN daily x 3 days (9/1, 9/2, 9/3)  Q4H CBGs and SSI ordered. Pt does not have hx of DM and 0 units of SSI required last 24h. Will reduce CBG and SSI to q6h  Will check labs in AM and continue to monitor electrolytes and BGs.    11/3, PharmD, BCPS Clinical Pharmacist 03/30/2018 7:43 AM

## 2018-03-30 NOTE — Progress Notes (Signed)
[facility] - [place] at [facility]   PATIENT NAME: Morgan Reid    MR#:  [id number]  DATE OF BIRTH:  1955/10/23  SUBJECTIVE:  No new complaints. Wants her protein shake cold  REVIEW OF SYSTEMS:   Review of Systems  Constitutional: Negative for chills, fever and weight loss.  HENT: Negative for ear discharge, ear pain and nosebleeds.   Eyes: Negative for blurred vision, pain and discharge.  Respiratory: Negative for sputum production, shortness of breath, wheezing and stridor.   Cardiovascular: Negative for chest pain, palpitations, orthopnea and PND.  Gastrointestinal: Negative for abdominal pain, diarrhea, nausea and vomiting.  Genitourinary: Negative for frequency and urgency.  Musculoskeletal: Negative for back pain and joint pain.  Neurological: Negative for sensory change, speech change, focal weakness and weakness.  Psychiatric/Behavioral: Negative for depression and hallucinations. The patient is not nervous/anxious.    Tolerating Diet: CLears Tolerating PT: self ambulatory  DRUG ALLERGIES:  No Known Allergies  VITALS:  Blood pressure 99/68, pulse 94, temperature 98.6 F (37 C), temperature source Oral, resp. rate 20, height 5\' 4"  (1.626 m), weight 49.4 kg, SpO2 100 %.  PHYSICAL EXAMINATION:   Physical Exam  GENERAL:  62 y.o.-year-old patient lying in the bed with no acute distress. Thin,cachectic EYES: Pupils equal, round, reactive to light and accommodation. No scleral icterus. Extraocular muscles intact.  HEENT: Head atraumatic, normocephalic. Oropharynx and nasopharynx clear.  NECK:  Supple, no jugular venous distention. No thyroid enlargement, no tenderness.  LUNGS: Normal breath sounds bilaterally, no wheezing, rales, rhonchi. No use of accessory muscles of respiration.  CARDIOVASCULAR: S1, S2 normal. No murmurs, rubs, or gallops.  ABDOMEN: Soft, nontender, nondistended. Bowel sounds present. No organomegaly or mass.  EXTREMITIES: No  cyanosis, clubbing or edema b/l.    NEUROLOGIC: Cranial nerves II through XII are intact. No focal Motor or sensory deficits b/l.   PSYCHIATRIC:  patient is alert and oriented x 3.  SKIN: No obvious rash, lesion, or ulcer.   LABORATORY PANEL:  CBC Recent Labs  Lab 03/29/18 0504  WBC 2.9*  HGB 8.9*  HCT 26.6*  PLT 219    Chemistries  Recent Labs  Lab 03/29/18 0504 03/30/18 0531  NA 138 137  K 4.0 3.6  CL 105 102  CO2 27 28  GLUCOSE 102* 96  BUN 7* 10  CREATININE 0.40* 0.31*  CALCIUM 8.4* 8.2*  MG 1.9  --   AST 14*  --   ALT 7  --   ALKPHOS 54  --   BILITOT 0.2*  --    Cardiac Enzymes No results for input(s): TROPONINI in the last 168 hours. RADIOLOGY:  No results found. ASSESSMENT AND PLAN:  62 year old female with past medical history of rheumatoid arthritis/connective tissue disease, osteoarthritis, colon mass who presents to the hospital from direct admission from the endoscopy suite.  1. Colonic stenosis -IV TPN for now  Surgery sometime later this week Continue H2 blocker Pain control PRN Maalox Patient's diet converted back to clear liquids per surgery  2. Hypokalemia potassium now replaced  3.  Anemia microcytic in nature patient's iron studies shows ferritin and iron level borderline norma -received one dose of IV iron  4. History of rheumatoid arthritis/connective tissue disease -continue maintenance methylprednisolone, Plaquenil.   Case discussed with Care Management/Social Worker. Management plans discussed with the patient, family and they are in agreement.  CODE STATUS: FULL  DVT Prophylaxis: lovenox  TOTAL TIME TAKING CARE OF THIS PATIENT: *30* minutes.  >50% time  spent on counselling and coordination of care  POSSIBLE D/C IN *few* DAYS, DEPENDING ON CLINICAL CONDITION.  Note: This dictation was prepared with Dragon dictation along with smaller phrase technology. Any transcriptional errors that result from this process are  unintentional.  Enedina Finner M.D on 03/30/2018 at 10:02 AM  Between 7am to 6pm - Pager - [phone]  After 6pm go to [email or web address] - Social research officer, government  [facility] [place] [facility]  Office  [phone]  CC: Primary care physician; System, Pcp Not InPatient ID: Morgan Reid, female   DOB: 20-Sep-1955, 62 y.o.   MRN: [id number]

## 2018-03-31 LAB — PHOSPHORUS: Phosphorus: 4.3 mg/dL (ref 2.5–4.6)

## 2018-03-31 LAB — GLUCOSE, CAPILLARY
GLUCOSE-CAPILLARY: 119 mg/dL — AB (ref 70–99)
Glucose-Capillary: 105 mg/dL — ABNORMAL HIGH (ref 70–99)
Glucose-Capillary: 116 mg/dL — ABNORMAL HIGH (ref 70–99)
Glucose-Capillary: 99 mg/dL (ref 70–99)

## 2018-03-31 LAB — BASIC METABOLIC PANEL
Anion gap: 6 (ref 5–15)
BUN: 11 mg/dL (ref 8–23)
CALCIUM: 8.4 mg/dL — AB (ref 8.9–10.3)
CO2: 30 mmol/L (ref 22–32)
CREATININE: 0.44 mg/dL (ref 0.44–1.00)
Chloride: 102 mmol/L (ref 98–111)
GFR calc non Af Amer: >60 mL/min (ref >60)
GLUCOSE: 92 mg/dL (ref 70–99)
Potassium: 3.7 mmol/L (ref 3.5–5.1)
Sodium: 138 mmol/L (ref 135–145)

## 2018-03-31 LAB — MAGNESIUM: Magnesium: 2 mg/dL (ref 1.7–2.4)

## 2018-03-31 MED ORDER — TRACE MINERALS CR-CU-MN-SE-ZN 10-1000-500-60 MCG/ML IV SOLN
INTRAVENOUS | Status: AC
  Administered 2018-03-31: 20:00:00 via INTRAVENOUS
  Filled 2018-03-31: qty 1440

## 2018-03-31 MED ORDER — FAT EMULSION PLANT BASED 20 % IV EMUL
180.0000 mL | INTRAVENOUS | Status: AC
  Administered 2018-03-31: 180 mL via INTRAVENOUS
  Filled 2018-03-31: qty 180

## 2018-03-31 NOTE — Consult Note (Signed)
PHARMACY - ADULT TOTAL PARENTERAL NUTRITION CONSULT NOTE   Pharmacy Consult for TPN Initiation and Management  Indication: Severe malnutrition  Patient Measurements: Height: 5\' 4"  (162.6 cm) Weight: 108 lb 6.4 oz (49.2 kg) IBW/kg (Calculated) : 54.7 TPN AdjBW (KG): 44.5 Body mass index is 18.61 kg/m.  Assessment: 62 yo female s/p sigmoidoscopy, which found signficant narrowing of sigmoid colon. Pharmacy was consulted to initiate TPN on POD 1 in 62 yo female with severe malnutrition in need of bowel surgery with anastomosis. Patient has had significant weight loss (20-30lbs over a few months) due to sigmoid mass, abdominal pain and possible partial obstruction due to the sigmoid stricture.  GI: GI recommends iron studies due to history of microcytic anemia, says inflammatory bowel disease panel is pending (which will not change course - a stricture biopsy/surgical specimen would me more indicative of crohn's disease). Endo: No Hx of DM, BG 94-141 Insulin requirements in the past 24 hours: 1 unit Lytes: K 3.7, mag 2.0, Phos 4.3, Ca 8.4, CorrCa 9.8 9/2: Albumin 2.3 Renal: Scr: 0.44 mg/dL, CrCl: 57 mL/min Hepatobil:  TPN Access: 8/31 @ 1900 TPN start date: 8/31 Nutritional Goals (per RD recommendation):  KCal: 1382 Protein: 72 g Fluid: 1620 mL  Goal TPN rate is: 60 mL/hr    Current Nutrition: TPN (at goal); On clear liquids- per surgery will not advance this because needs surgery.   Plan:  Continue TPN at goal rate Clinimix E 5/15 at 60 mL/hr with ILE 20% 15 ml/hr x 12 hours.   Add MVI, trace elements to TPN daily. Pt has completed thiamine x 3 days.   Continue CBG and SSI sensitive scale q6h  Will check labs in AM and continue to monitor electrolytes and BGs.    6/15, PharmD, BCPS Clinical Pharmacist 03/31/2018 7:20 AM

## 2018-03-31 NOTE — Progress Notes (Signed)
SURGICAL PROGRESS NOTE   Hospital Day(s): 5.   Post op day(s): 5 Days Post-Op.   Interval History: Patient seen and examined, no acute events or new complaints overnight. Patient reports trying to tolerate the clear liquid. Able to eat the jello today. Continue with some pain and mild nausea intermittent.   Vital signs in last 24 hours: [min-max] current  Temp:  [98.2 F (36.8 C)-99.1 F (37.3 C)] 98.2 F (36.8 C) (09/04 0451) Pulse Rate:  [84-99] 97 (09/04 0451) Resp:  [14-17] 16 (09/04 0451) BP: (100-103)/(69-87) 101/71 (09/04 0451) SpO2:  [100 %] 100 % (09/04 0451) Weight:  [49.2 kg] 49.2 kg (09/04 0425)     Height: 5\' 4"  (162.6 cm) Weight: 49.2 kg BMI (Calculated): 18.6   Physical Exam:  Constitutional: alert, cooperative and no distress  Respiratory: breathing non-labored at rest  Cardiovascular: regular rate and sinus rhythm  Gastrointestinal: soft, non-tender, and non-distended  Labs:  CBC Latest Ref Rng & Units 03/29/2018 03/28/2018 03/27/2018  WBC 3.6 - 11.0 K/uL 2.9(L) 3.0(L) 3.3(L)  Hemoglobin 12.0 - 16.0 g/dL 03/29/2018) 8.9(L) 9.2(L)  Hematocrit 35.0 - 47.0 % 26.6(L) 26.7(L) 27.9(L)  Platelets 150 - 440 K/uL 219 234 231   CMP Latest Ref Rng & Units 03/31/2018 03/30/2018 03/29/2018  Glucose 70 - 99 mg/dL 92 96 05/29/2018)  BUN 8 - 23 mg/dL 11 10 7(L)  Creatinine 0.44 - 1.00 mg/dL 865(H 8.46) 9.62(X)  Sodium 135 - 145 mmol/L 138 137 138  Potassium 3.5 - 5.1 mmol/L 3.7 3.6 4.0  Chloride 98 - 111 mmol/L 102 102 105  CO2 22 - 32 mmol/L 30 28 27   Calcium 8.9 - 10.3 mg/dL 5.28(U) ) 1.3(K)  Total Protein 6.5 - 8.1 g/dL - - 6.4(L)  Total Bilirubin 0.3 - 1.2 mg/dL - - 4.4(W)  Alkaline Phos 38 - 126 U/L - - 54  AST 15 - 41 U/L - - 14(L)  ALT 0 - 44 U/L - - 7    Imaging studies: No new pertinent imaging studies   Assessment/Plan:  62 y.o.femalewith sigmoid stricture of unknown etiology (inflammatory bowel disease, vs diverticular stricture, vs mass). - Severe  Malnutrition:Pre Albumin in 5.8. With this albumin patient can not undergo bowel surgery and anastomosis. Needs at least a week of TPN and supplements to improve nutrition enough to have the best surgery possible with deceased risk of complication. (sigmoid colectomy with anastomosis with protective loop ileostomy).  Will continue to follow and assess progress of nutritional status. Today tolerating clear liquid better. Continue TPN. - Inflammatory bowel disease markers with elevated Sacchoromyces cerevisiae, IgG. Hopefully GI or Internal medicine will evaluate and give recommendations regarding this results.  - Colonoscopy biopsy pending - Internal medicine signed off since feels does not need to continue optimizing patient. Surgery will need to continue optimizing patient and nutrition status since patient is severely malnourished. No surgical management until patient is optimized for surgery.   7.2(Z, MD

## 2018-03-31 NOTE — Progress Notes (Signed)
   03/31/18 1300  Clinical Encounter Type  Visited With Other (Comment) Teacher, English as a foreign language for the patient.)  Visit Type Follow-up  Recommendations Volunteer to bring book.   Patient requested a book (mystery, crime, or drama). Chaplain passed the request to [employer] who will bring the patient a book.

## 2018-03-31 NOTE — Consult Note (Signed)
Case discussed w/ Dr. Marva Panda & Dr. Norma Fredrickson. Dr. Norma Fredrickson discussed with pathology and on colonoscopy path specimens there were no evidence of inflammatory features lowering suspicion for IBD despite elevated saccharomyces cerevisiae lab. Negative p ANCA. Per pathology will consider additional staining on surgical specimen for clarification if concerning IBD features. Hold off on any IBD treatment at this time.   GI available as needed with any questions   -Tawni Pummel, PA-C

## 2018-03-31 NOTE — Progress Notes (Signed)
Patient was initially reluctant to talk, but later opened up about her anger and sadness about her medical condition, lifestyle change, and difficult family dynamics with her children.  Chaplain listened, provided reflective questions, and offered emotional support. Patient was able to begin processing the severity of her surgery. She has uncertainty, a sense of powerlessness, and suppressed anger. Discussion transitioned to the potential for positive change. Patient will consider. Chaplain will return to visit later in the week for a follow-up.

## 2018-03-31 NOTE — Progress Notes (Signed)
[facility] - [place] at [facility]   PATIENT NAME: Morgan Reid    MR#:  [id number]  DATE OF BIRTH:  08/16/1955  SUBJECTIVE:  No new complaints. Wants her protein shakes cold Does not like too much jello REVIEW OF SYSTEMS:   Review of Systems  Constitutional: Negative for chills, fever and weight loss.  HENT: Negative for ear discharge, ear pain and nosebleeds.   Eyes: Negative for blurred vision, pain and discharge.  Respiratory: Negative for sputum production, shortness of breath, wheezing and stridor.   Cardiovascular: Negative for chest pain, palpitations, orthopnea and PND.  Gastrointestinal: Negative for abdominal pain, diarrhea, nausea and vomiting.  Genitourinary: Negative for frequency and urgency.  Musculoskeletal: Negative for back pain and joint pain.  Neurological: Negative for sensory change, speech change, focal weakness and weakness.  Psychiatric/Behavioral: Negative for depression and hallucinations. The patient is not nervous/anxious.    Tolerating Diet: CLears Tolerating PT: self ambulatory  DRUG ALLERGIES:  No Known Allergies  VITALS:  Blood pressure 101/71, pulse 97, temperature 98.2 F (36.8 C), temperature source Oral, resp. rate 16, height 5\' 4"  (1.626 m), weight 49.2 kg, SpO2 100 %.  PHYSICAL EXAMINATION:   Physical Exam  GENERAL:  62 y.o.-year-old patient lying in the bed with no acute distress. Thin,cachectic EYES: Pupils equal, round, reactive to light and accommodation. No scleral icterus. Extraocular muscles intact.  HEENT: Head atraumatic, normocephalic. Oropharynx and nasopharynx clear.  NECK:  Supple, no jugular venous distention. No thyroid enlargement, no tenderness.  LUNGS: Normal breath sounds bilaterally, no wheezing, rales, rhonchi. No use of accessory muscles of respiration.  CARDIOVASCULAR: S1, S2 normal. No murmurs, rubs, or gallops.  ABDOMEN: Soft, nontender, nondistended. Bowel sounds present. No  organomegaly or mass.  EXTREMITIES: No cyanosis, clubbing or edema b/l.    NEUROLOGIC: Cranial nerves II through XII are intact. No focal Motor or sensory deficits b/l.   PSYCHIATRIC:  patient is alert and oriented x 3.  SKIN: No obvious rash, lesion, or ulcer.   LABORATORY PANEL:  CBC Recent Labs  Lab 03/29/18 0504  WBC 2.9*  HGB 8.9*  HCT 26.6*  PLT 219    Chemistries  Recent Labs  Lab 03/29/18 0504  03/31/18 0424  NA 138   < > 138  K 4.0   < > 3.7  CL 105   < > 102  CO2 27   < > 30  GLUCOSE 102*   < > 92  BUN 7*   < > 11  CREATININE 0.40*   < > 0.44  CALCIUM 8.4*   < > 8.4*  MG 1.9  --  2.0  AST 14*  --   --   ALT 7  --   --   ALKPHOS 54  --   --   BILITOT 0.2*  --   --    < > = values in this interval not displayed.   Cardiac Enzymes No results for input(s): TROPONINI in the last 168 hours. RADIOLOGY:  No results found. ASSESSMENT AND PLAN:  62 year old female with past medical history of rheumatoid arthritis/connective tissue disease, osteoarthritis, colon mass who presents to the hospital from direct admission from the endoscopy suite.  1. Colonic stenosis -IV TPN for now  Surgery  On hold till nutrition gets better Continue H2 blocker Pain control PRN Maalox Patient's diet converted back to clear liquids per surgery  2. Hypokalemia potassium now replaced  3.  Anemia microcytic in nature patient's iron studies shows  ferritin and iron level borderline norma -received one dose of IV iron  4. History of rheumatoid arthritis/connective tissue disease -continue maintenance methylprednisolone, Plaquenil.  Will transfer pt under Dr Hazle Quant. Medically pt ok   Case discussed with Care Management/Social Worker. Management plans discussed with the patient, family and they are in agreement.  CODE STATUS: FULL  DVT Prophylaxis: lovenox  TOTAL TIME TAKING CARE OF THIS PATIENT: *30* minutes.  >50% time spent on counselling and coordination of  care  POSSIBLE D/C IN *few* DAYS, DEPENDING ON CLINICAL CONDITION.  Note: This dictation was prepared with Dragon dictation along with smaller phrase technology. Any transcriptional errors that result from this process are unintentional.  Enedina Finner M.D on 03/31/2018 at 10:25 AM  Between 7am to 6pm - Pager - [phone]  After 6pm go to [email or web address] - Social research officer, government  [facility] [place] [facility]  Office  [phone]  CC: Primary care physician; System, Pcp Not InPatient ID: Genna Casimir, female   DOB: 02/17/1956, 62 y.o.   MRN: [id number]

## 2018-04-01 LAB — GLUCOSE, CAPILLARY
GLUCOSE-CAPILLARY: 97 mg/dL (ref 70–99)
GLUCOSE-CAPILLARY: 101 mg/dL — AB (ref 70–99)
Glucose-Capillary: 113 mg/dL — ABNORMAL HIGH (ref 70–99)

## 2018-04-01 LAB — COMPREHENSIVE METABOLIC PANEL
ALT: 13 U/L (ref 0–44)
AST: 21 U/L (ref 15–41)
Albumin: 2.6 g/dL — ABNORMAL LOW (ref 3.5–5.0)
Alkaline Phosphatase: 56 U/L (ref 38–126)
Anion gap: 7 (ref 5–15)
BILIRUBIN TOTAL: 0.4 mg/dL (ref 0.3–1.2)
BUN: 13 mg/dL (ref 8–23)
CHLORIDE: 103 mmol/L (ref 98–111)
CO2: 28 mmol/L (ref 22–32)
Calcium: 8.6 mg/dL — ABNORMAL LOW (ref 8.9–10.3)
Creatinine, Ser: 0.53 mg/dL (ref 0.44–1.00)
Glucose, Bld: 97 mg/dL (ref 70–99)
POTASSIUM: 3.9 mmol/L (ref 3.5–5.1)
Sodium: 138 mmol/L (ref 135–145)
TOTAL PROTEIN: 7.1 g/dL (ref 6.5–8.1)

## 2018-04-01 LAB — MAGNESIUM: MAGNESIUM: 2.1 mg/dL (ref 1.7–2.4)

## 2018-04-01 LAB — PHOSPHORUS: PHOSPHORUS: 4.3 mg/dL (ref 2.5–4.6)

## 2018-04-01 LAB — SURGICAL PATHOLOGY

## 2018-04-01 MED ORDER — TRACE MINERALS CR-CU-MN-SE-ZN 10-1000-500-60 MCG/ML IV SOLN
INTRAVENOUS | Status: AC
  Administered 2018-04-01: 18:00:00 via INTRAVENOUS
  Filled 2018-04-01: qty 1440

## 2018-04-01 MED ORDER — POLYETHYLENE GLYCOL 3350 17 GM/SCOOP PO POWD
1.0000 | Freq: Once | ORAL | Status: AC
  Administered 2018-04-01: 255 g via ORAL
  Filled 2018-04-01: qty 255

## 2018-04-01 MED ORDER — FAT EMULSION PLANT BASED 20 % IV EMUL
250.0000 mL | INTRAVENOUS | Status: AC
  Administered 2018-04-01: 250 mL via INTRAVENOUS
  Filled 2018-04-01: qty 250

## 2018-04-01 NOTE — Plan of Care (Signed)
Resting quietly in bed,no visual distress noted,no complaints voiced.continue with planned regimen.Callbell within reach.

## 2018-04-01 NOTE — Consult Note (Signed)
PHARMACY - ADULT TOTAL PARENTERAL NUTRITION CONSULT NOTE   Pharmacy Consult for TPN Initiation and Management  Indication: Severe malnutrition  Patient Measurements: Height: 5\' 4"  (162.6 cm) Weight: 108 lb 6.4 oz (49.2 kg) IBW/kg (Calculated) : 54.7 TPN AdjBW (KG): 44.5 Body mass index is 18.61 kg/m.  Assessment: 62 yo female s/p sigmoidoscopy, which found signficant narrowing of sigmoid colon. Pharmacy was consulted to initiate TPN on POD 1 in 62 yo female with severe malnutrition in need of bowel surgery with anastomosis. Patient has had significant weight loss (20-30lbs over a few months) due to sigmoid mass, abdominal pain and possible partial obstruction due to the sigmoid stricture.  GI: GI recommends iron studies due to history of microcytic anemia, says inflammatory bowel disease panel is pending (which will not change course - a stricture biopsy/surgical specimen would me more indicative of crohn's disease). Endo: No Hx of DM, BG 97-119 Insulin requirements in the past 24 hours: 0 unit Lytes: K 3.9, mag 2.1, Phos 4.3, Ca 8.6, CorrCa 9.72 9/2: Albumin 2.6 Renal: Scr: 0.53 mg/dL, CrCl: 11/2  mL/min Hepatobil:  TPN Access: 8/31 @ 1900 TPN start date: 8/31 Nutritional Goals (per RD recommendation):  KCal: 1382 Protein: 72 g Fluid: 1620 mL  Goal TPN rate is: 60 mL/hr    Current Nutrition: TPN (at goal); On clear liquids- per surgery will not advance this because needs surgery.   Plan:  Continue TPN at goal rate Clinimix E 5/15 at 60 mL/hr with ILE 20% 15 ml/hr x 12 hours.   Add MVI, trace elements to TPN daily. Pt has completed thiamine x 3 days.   Continue CBG and SSI sensitive scale q6h  Will check labs in AM and continue to monitor electrolytes and BGs.   6/15, PharmD, BCPS 04/01/2018 11:47 AM

## 2018-04-01 NOTE — Progress Notes (Signed)
Initial Nutrition Assessment  DOCUMENTATION CODES:   Severe malnutrition in context of acute illness/injury  INTERVENTION:   Recommend check iron/anemia labs including iron, transferrin, TIBC, and ferritin.  Continue Clinimix 5/15 with electrolytes at goal rate of 60 ml/hr   Continue 20% lipids _0 /hr x 12 hrs/day    Regimen provides 1382kcal/day, 72g/day protein, 1631m volume    Continue MVI daily   Continue trace elements daily    Daily weights   NUTRITION DIAGNOSIS:   Severe Malnutrition related to acute illness as evidenced by 22% percent weight loss in <5 months, mild to moderate fat depletions, severe muscle depletions.  GOAL:   Patient will meet greater than or equal to 90% of their needs  -met with TPN  MONITOR:   Diet advancement, Labs, Weight trends, Skin, I & O's, Other (Comment)(TPN)  ASSESSMENT:   62y.o. female with a known history of collagen vascular disease, rheumatoid arthritis, chronic anemia who presents with sigmoid stricture of unknown etiology   Pt tolerating TPN well; recommend continue at goal rate until patient able to meet 60% of her estimated needs via oral intake. Pt taking in some clear liquids and jello but still having some intermittent nausea. Per chart, pt with 7lb weight gain since admit; RD will continue to monitor daily weights.   Medications reviewed and include: zofran  Labs reviewed: K 3.9 wnl, Ca 8.6(L) adj. 9.72 wnl, P 4.3 wnl, Mg 2.1 wnl, alb 2.6(L) Prealbumin- 5.8(L)- 9/2 Triglycerides- 76- 9/2  Diet Order:   Diet Order            Diet clear liquid Room service appropriate? Yes; Fluid consistency: Thin  Diet effective now             EDUCATION NEEDS:   Education needs have been addressed  Skin:  Skin Assessment: Reviewed RN Assessment(ecchymosis )  Last BM:  9/3  Height:   Ht Readings from Last 1 Encounters:  03/26/18 _1  (1.626 m)    Weight:   Wt Readings from Last 1 Encounters:  03/31/18 49.2  kg    Ideal Body Weight:  54.5 kg  BMI:  Body mass index is 18.61 kg/m.  Estimated Nutritional Needs:   Kcal:  1300-1500kcal/day   Protein:  70-78g/day   Fluid:  >1.3L/day   CKoleen DistanceMS, RD, LDN Pager #- [phone]#- [phone] Hours Pager: [phone]

## 2018-04-01 NOTE — Progress Notes (Signed)
SURGICAL PROGRESS NOTE   Hospital Day(s): 6.   Post op day(s): 6 Days Post-Op.   Interval History: Patient seen and examined, no acute events or new complaints overnight. Patient reports feeling OK. Refers is nervious about surgery. Refers is following or instructions to get better and prepared for surgery. Denies vomiting.   Vital signs in last 24 hours: [min-max] current  Temp:  [98.2 F (36.8 C)-98.8 F (37.1 C)] 98.5 F (36.9 C) (09/05 1248) Pulse Rate:  [101-128] 115 (09/05 1248) Resp:  [14-22] 14 (09/05 1248) BP: (96-110)/(48-98) 100/48 (09/05 1248) SpO2:  [100 %] 100 % (09/05 1248) Weight:  [44.9 kg] 44.9 kg (09/05 1456)     Height: 5\' 4"  (162.6 cm) Weight: 44.9 kg BMI (Calculated): 16.98   Physical Exam:  Constitutional: alert, cooperative and no distress  Respiratory: breathing non-labored at rest  Cardiovascular: regular rate and sinus rhythm  Gastrointestinal: soft, non-tender, and non-distended  Labs:  CBC Latest Ref Rng & Units 03/29/2018 03/28/2018 03/27/2018  WBC 3.6 - 11.0 K/uL 2.9(L) 3.0(L) 3.3(L)  Hemoglobin 12.0 - 16.0 g/dL 03/29/2018) 8.9(L) 9.2(L)  Hematocrit 35.0 - 47.0 % 26.6(L) 26.7(L) 27.9(L)  Platelets 150 - 440 K/uL 219 234 231   CMP Latest Ref Rng & Units 04/01/2018 03/31/2018 03/30/2018  Glucose 70 - 99 mg/dL 97 92 96  BUN 8 - 23 mg/dL 13 11 10   Creatinine 0.44 - 1.00 mg/dL 05/30/2018 0.93)  Sodium 135 - 145 mmol/L 138 138 137  Potassium 3.5 - 5.1 mmol/L 3.9 3.7 3.6  Chloride 98 - 111 mmol/L 103 102 102  CO2 22 - 32 mmol/L 28 30 28   Calcium 8.9 - 10.3 mg/dL 2.35) 5.73(U) )  Total Protein 6.5 - 8.1 g/dL 7.1 - -  Total Bilirubin 0.3 - 1.2 mg/dL 0.4 - -  Alkaline Phos 38 - 126 U/L 56 - -  AST 15 - 41 U/L 21 - -  ALT 0 - 44 U/L 13 - -    Imaging studies: No new pertinent imaging studies   Assessment/Plan:  62 y.o.femalewith sigmoid stricture of unknown etiology (inflammatory bowel disease, vs diverticular stricture, vs mass). -  SevereMalnutrition:Pre Albumin in 5.8. With this albumin patient can not undergo bowel surgery and anastomosis. Needs at least a week ofTPNand supplements to improve nutrition enough to have the best surgery possible with deceased risk of complication. (sigmoid colectomy with anastomosis with protective loop ileostomy). Will continue to follow and assess progress of nutritional status. Will continue with TPN and clear liquids with protein supplements - Inflammatory bowel disease markers with elevated Sacchoromyces cerevisiae, IgG. GI refers low risk of inflammatory bowel disease.  - Colonoscopy biopsy with focal non specific inflammation.  - Internal medicine signed off.   5.4(Y, MD

## 2018-04-02 LAB — GLUCOSE, CAPILLARY
GLUCOSE-CAPILLARY: 93 mg/dL (ref 70–99)
GLUCOSE-CAPILLARY: 104 mg/dL — AB (ref 70–99)
GLUCOSE-CAPILLARY: 114 mg/dL — AB (ref 70–99)
Glucose-Capillary: 113 mg/dL — ABNORMAL HIGH (ref 70–99)

## 2018-04-02 LAB — BASIC METABOLIC PANEL
ANION GAP: 5 (ref 5–15)
BUN: 11 mg/dL (ref 8–23)
CHLORIDE: 106 mmol/L (ref 98–111)
CO2: 27 mmol/L (ref 22–32)
Calcium: 8.3 mg/dL — ABNORMAL LOW (ref 8.9–10.3)
Creatinine, Ser: 0.37 mg/dL — ABNORMAL LOW (ref 0.44–1.00)
GFR calc Af Amer: >60 mL/min (ref >60)
Glucose, Bld: 86 mg/dL (ref 70–99)
Potassium: 3.8 mmol/L (ref 3.5–5.1)
SODIUM: 138 mmol/L (ref 135–145)

## 2018-04-02 LAB — PHOSPHORUS: PHOSPHORUS: 4.2 mg/dL (ref 2.5–4.6)

## 2018-04-02 LAB — MAGNESIUM: Magnesium: 2.2 mg/dL (ref 1.7–2.4)

## 2018-04-02 MED ORDER — TRACE MINERALS CR-CU-MN-SE-ZN 10-1000-500-60 MCG/ML IV SOLN
INTRAVENOUS | Status: AC
  Administered 2018-04-02: 18:00:00 via INTRAVENOUS
  Filled 2018-04-02: qty 1440

## 2018-04-02 MED ORDER — FAT EMULSION PLANT BASED 20 % IV EMUL
250.0000 mL | INTRAVENOUS | Status: AC
  Administered 2018-04-02: 250 mL via INTRAVENOUS
  Filled 2018-04-02: qty 250

## 2018-04-02 NOTE — Consult Note (Signed)
PHARMACY - ADULT TOTAL PARENTERAL NUTRITION CONSULT NOTE   Pharmacy Consult for TPN Initiation and Management  Indication: Severe malnutrition  Patient Measurements: Height: 5\' 4"  (162.6 cm) Weight: 98 lb 15.8 oz (44.9 kg) IBW/kg (Calculated) : 54.7 TPN AdjBW (KG): 44.5 Body mass index is 16.99 kg/m.  Assessment: 62 yo female s/p sigmoidoscopy, which found signficant narrowing of sigmoid colon. Pharmacy was consulted to initiate TPN on POD 1 in 62 yo female with severe malnutrition in need of bowel surgery with anastomosis. Patient has had significant weight loss (20-30lbs over a few months) due to sigmoid mass, abdominal pain and possible partial obstruction due to the sigmoid stricture.  GI: GI recommends iron studies due to history of microcytic anemia, says inflammatory bowel disease panel is pending (which will not change course - a stricture biopsy/surgical specimen would me more indicative of crohn's disease). Endo: No Hx of DM, BG 97-114 Insulin requirements in the past 24 hours: 0 unit Lytes: K 3.8, mag 2.2, Phos 4.2, Ca 8.3, CorrCa 9.42 9/2: Albumin 2.6 Renal: Scr: 0.37 mg/dL, CrCl: 11/2  mL/min Hepatobil:  TPN Access: 8/31 @ 1900 TPN start date: 8/31 Nutritional Goals (per RD recommendation):  KCal: 1382 Protein: 72 g Fluid: 1620 mL  Goal TPN rate is: 60 mL/hr    Current Nutrition: TPN (at goal); On clear liquids- per surgery will not advance this because needs surgery.   Plan:  Continue TPN at goal rate Clinimix E 5/15 at 60 mL/hr with ILE 20% 15 ml/hr x 12 hours.   Add MVI, trace elements to TPN daily. Pt has completed thiamine x 3 days.   Continue CBG and SSI sensitive scale q6h  Will check labs in AM and continue to monitor electrolytes and BGs.   6/15, PharmD, BCPS 04/02/2018 4:17 PM

## 2018-04-02 NOTE — Progress Notes (Signed)
SURGICAL PROGRESS NOTE   Hospital Day(s): 7.   Post op day(s): 7 Days Post-Op.   Interval History: Patient seen and examined, no acute events or new complaints overnight. Patient reports feeling ok. Patient refers continue to try laxatives, denies vomiting. Refers passing gas and having bowel movements.   Vital signs in last 24 hours: [min-max] current  Temp:  [98.5 F (36.9 C)-98.7 F (37.1 C)] 98.5 F (36.9 C) (09/06 1145) Pulse Rate:  [104-105] 104 (09/06 1145) Resp:  [17-20] 17 (09/06 1145) BP: (103-114)/(74-86) 114/86 (09/06 1145) SpO2:  [97 %-100 %] 100 % (09/06 1145) Weight:  [44.9 kg] 44.9 kg (09/06 0644)     Height: 5\' 4"  (162.6 cm) Weight: 44.9 kg BMI (Calculated): 16.98    Physical Exam:  Constitutional: alert, cooperative and no distress  Respiratory: breathing non-labored at rest  Cardiovascular: regular rate and sinus rhythm  Gastrointestinal: soft, non-tender, and non-distended  Labs:  CBC Latest Ref Rng & Units 03/29/2018 03/28/2018 03/27/2018  WBC 3.6 - 11.0 K/uL 2.9(L) 3.0(L) 3.3(L)  Hemoglobin 12.0 - 16.0 g/dL 03/29/2018) 8.9(L) 9.2(L)  Hematocrit 35.0 - 47.0 % 26.6(L) 26.7(L) 27.9(L)  Platelets 150 - 440 K/uL 219 234 231   CMP Latest Ref Rng & Units 04/02/2018 04/01/2018 03/31/2018  Glucose 70 - 99 mg/dL 86 97 92  BUN 8 - 23 mg/dL 11 13 11   Creatinine 0.44 - 1.00 mg/dL 05/31/2018) 7.78(E  Sodium 135 - 145 mmol/L 138 138 138  Potassium 3.5 - 5.1 mmol/L 3.8 3.9 3.7  Chloride 98 - 111 mmol/L 106 103 102  CO2 22 - 32 mmol/L 27 28 30   Calcium 8.9 - 10.3 mg/dL 8.3(L) 8.6(L) 8.4(L)  Total Protein 6.5 - 8.1 g/dL - 7.1 -  Total Bilirubin 0.3 - 1.2 mg/dL - 0.4 -  Alkaline Phos 38 - 126 U/L - 56 -  AST 15 - 41 U/L - 21 -  ALT 0 - 44 U/L - 13 -   Imaging studies: No new pertinent imaging studies  Assessment/Plan:  62 y.o.femalewith sigmoid stricture of unknown etiology (inflammatory bowel disease, vs diverticular stricture, vs mass). - SevereMalnutrition:Pre Albumin  in 5.8. With this albumin patient can not undergo bowel surgery and anastomosis. Needs at least a week ofTPNand supplements to improve nutrition enough to have the best surgery possible with deceased risk of complication. (sigmoid colectomy with anastomosis with protective loop ileostomy). Patient continue with TPN and tolerating clear liquid. Patient cannot be advance to full liquid due to pain and to avoid to continue stool formation and accumulation which is already significant. Bowel prep is being given very slowly due to patient discomfort.  - Appreciated Wound care evaluation, orientation and marking for placement of ostomies. This is very important for the patient care after surgery.  - Inflammatory bowel disease markers with elevated Sacchoromyces cerevisiae, IgG. GI refers low risk of inflammatory bowel disease.  - Colonoscopy biopsy with focal non specific inflammation.  -Internal medicine signed off.  5.36, MD

## 2018-04-02 NOTE — Consult Note (Addendum)
WOC Nurse requested for preoperative stoma site marking  Discussed surgical procedure and stoma creation with patient.  Explained role of the WOC nurse team.  Provided the patient with educational booklet and provided samples of pouching options.  Answered patient's questions.   Examined patient lying and sitting in order to place the marking in the patient's visual field, away from any creases or abdominal contour issues and within the rectus muscle.  Attempted to mark below the patient's belt line. Was unable to locate lower then where the markings were placed, since skin folds appear when she sits forward and should be avoided if possible. Marked for colostomy in the LLQ  __5__ cm to the left of the umbilicus and _1___cm below the umbilicus.  Marked for ileostomy in the RLQ  __5__cm to the right of the umbilicus and  __1__ cm below the umbilicus.  Patient's abdomen cleansed with CHG wipes at site markings, allowed to air dry prior to marking.Covered marks with thin film transparent dressing to preserve mark until date of surgery.   WOC Nurse team will follow up with patient after surgery for continue ostomy care and teaching if she receives an ostomy. Cammie Mcgee MSN, RN, CWOCN, [place], CNS [phone].

## 2018-04-02 NOTE — Progress Notes (Signed)
   04/02/18 1000  Clinical Encounter Type  Visited With Patient  Visit Type Follow-up  Recommendations Continued follow-up.  Spiritual Encounters  Spiritual Needs Emotional Advertising copywriter)  Stress Factors  Patient Stress Factors Health changes (Patient continues to be concerned about surgery)   Patient seemed improved with more energy. She processed the struggle with being co-dependent, as she partially supports her son and his girlfriend. 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 We talked about delineating between what supported her health and those things that caused her stress. Patient envisions having a conversation able co-dependency and how it negatively impacts her health. She requests continuing pastoral care through her surgery and recovery.

## 2018-04-03 LAB — COMPREHENSIVE METABOLIC PANEL
ALBUMIN: 2.5 g/dL — AB (ref 3.5–5.0)
ALT: 78 U/L — ABNORMAL HIGH (ref 0–44)
AST: 78 U/L — AB (ref 15–41)
Alkaline Phosphatase: 71 U/L (ref 38–126)
Anion gap: 5 (ref 5–15)
BILIRUBIN TOTAL: 0.2 mg/dL — AB (ref 0.3–1.2)
BUN: 11 mg/dL (ref 8–23)
CHLORIDE: 105 mmol/L (ref 98–111)
CO2: 28 mmol/L (ref 22–32)
Calcium: 8.4 mg/dL — ABNORMAL LOW (ref 8.9–10.3)
Creatinine, Ser: 0.61 mg/dL (ref 0.44–1.00)
GFR calc Af Amer: >60 mL/min (ref >60)
GFR calc non Af Amer: >60 mL/min (ref >60)
GLUCOSE: 88 mg/dL (ref 70–99)
Potassium: 3.5 mmol/L (ref 3.5–5.1)
SODIUM: 138 mmol/L (ref 135–145)
Total Protein: 6.7 g/dL (ref 6.5–8.1)

## 2018-04-03 LAB — CBC WITH DIFFERENTIAL/PLATELET
BASOS ABS: 0 10*3/uL (ref 0–0.1)
BASOS PCT: 1 %
EOS PCT: 4 %
Eosinophils Absolute: 0.1 10*3/uL (ref 0–0.7)
HEMATOCRIT: 24.9 % — AB (ref 35.0–47.0)
Hemoglobin: 8.2 g/dL — ABNORMAL LOW (ref 12.0–16.0)
LYMPHS PCT: 35 %
Lymphs Abs: 1.3 10*3/uL (ref 1.0–3.6)
MCH: 24.5 pg — ABNORMAL LOW (ref 26.0–34.0)
MCHC: 32.9 g/dL (ref 32.0–36.0)
MCV: 74.5 fL — ABNORMAL LOW (ref 80.0–100.0)
Monocytes Absolute: 0.3 10*3/uL (ref 0.2–0.9)
Monocytes Relative: 9 %
Neutro Abs: 1.8 10*3/uL (ref 1.4–6.5)
Neutrophils Relative %: 51 %
PLATELETS: 192 10*3/uL (ref 150–440)
RBC: 3.35 MIL/uL — AB (ref 3.80–5.20)
RDW: 17.3 % — AB (ref 11.5–14.5)
WBC: 3.6 10*3/uL (ref 3.6–11.0)

## 2018-04-03 LAB — GLUCOSE, CAPILLARY
GLUCOSE-CAPILLARY: 112 mg/dL — AB (ref 70–99)
GLUCOSE-CAPILLARY: 93 mg/dL (ref 70–99)
Glucose-Capillary: 103 mg/dL — ABNORMAL HIGH (ref 70–99)
Glucose-Capillary: 92 mg/dL (ref 70–99)

## 2018-04-03 MED ORDER — TRACE MINERALS CR-CU-MN-SE-ZN 10-1000-500-60 MCG/ML IV SOLN
INTRAVENOUS | Status: AC
  Administered 2018-04-03: 18:00:00 via INTRAVENOUS
  Filled 2018-04-03: qty 1440

## 2018-04-03 MED ORDER — FAT EMULSION PLANT BASED 20 % IV EMUL
250.0000 mL | INTRAVENOUS | Status: AC
  Administered 2018-04-03: 250 mL via INTRAVENOUS
  Filled 2018-04-03: qty 250

## 2018-04-03 NOTE — Consult Note (Signed)
PHARMACY - ADULT TOTAL PARENTERAL NUTRITION CONSULT NOTE   Pharmacy Consult for TPN Initiation and Management  Indication: Severe malnutrition  Patient Measurements: Height: 5\' 4"  (162.6 cm) Weight: 112 lb 7 oz (51 kg) IBW/kg (Calculated) : 54.7 TPN AdjBW (KG): 44.5 Body mass index is 19.3 kg/m.  Assessment: 62 yo female s/p sigmoidoscopy, which found signficant narrowing of sigmoid colon. Pharmacy was consulted to initiate TPN on POD 1 in 62 yo female with severe malnutrition in need of bowel surgery with anastomosis. Patient has had significant weight loss (20-30lbs over a few months) due to sigmoid mass, abdominal pain and possible partial obstruction due to the sigmoid stricture.  GI: GI recommends iron studies due to history of microcytic anemia, says inflammatory bowel disease panel is pending (which will not change course - a stricture biopsy/surgical specimen would me more indicative of crohn's disease). Endo: No Hx of DM, BG 97-114 Insulin requirements in the past 24 hours: 0 unit Lytes: K 3.5,Ca 8.4, alb 2.5 CorrCa 9.6 9/7: Albumin 2.5 Renal: Scr: 0.61 mg/dL, CrCl: 11/7  mL/min Hepatobil:  TPN Access: 8/31 @ 1900 TPN start date: 8/31 Nutritional Goals (per RD recommendation):  KCal: 1382 Protein: 72 g Fluid: 1620 mL  Goal TPN rate is: 60 mL/hr    Current Nutrition: TPN (at goal); On clear liquids- per surgery will not advance this because needs surgery.   Plan:  Continue TPN at goal rate Clinimix E 5/15 at 60 mL/hr with ILE 20% 15 ml/hr x 12 hours.   Add MVI, trace elements to TPN daily. Pt has completed thiamine x 3 days.   Patient off SSI (has not required any SSI x 4 days)  Will check labs on 9/9 and continue to monitor electrolytes  11/9 PharmD Clinical Pharmacist 04/03/2018

## 2018-04-03 NOTE — Plan of Care (Signed)
Continue planned regimen 

## 2018-04-03 NOTE — Progress Notes (Signed)
SURGICAL PROGRESS NOTE   Hospital Day(s): 8.   Post op day(s): 8 Days Post-Op.   Interval History: Patient seen and examined, no acute events or new complaints overnight. Patient reports feeling nervous about the surgery. Patient refers is trying to do the best for surgery. Denies vomiting. Denies fever.  Vital signs in last 24 hours: [min-max] current  Temp:  [98.4 F (36.9 C)-98.8 F (37.1 C)] 98.4 F (36.9 C) (09/07 0458) Pulse Rate:  [97-104] 97 (09/07 0458) Resp:  [16-17] 16 (09/07 0458) BP: (91-114)/(65-86) 91/68 (09/07 0458) SpO2:  [98 %-100 %] 98 % (09/07 0458) Weight:  [51 kg] 51 kg (09/07 0640)     Height: 5\' 4"  (162.6 cm) Weight: 51 kg BMI (Calculated): 19.29    Physical Exam:  Constitutional: alert, cooperative and no distress  Respiratory: breathing non-labored at rest  Cardiovascular: regular rate and sinus rhythm  Gastrointestinal: soft, non-tender, and non-distended  Labs:  CBC Latest Ref Rng & Units 04/03/2018 03/29/2018 03/28/2018  WBC 3.6 - 11.0 K/uL 3.6 2.9(L) 3.0(L)  Hemoglobin 12.0 - 16.0 g/dL 8.2(L) 8.9(L) 8.9(L)  Hematocrit 35.0 - 47.0 % 24.9(L) 26.6(L) 26.7(L)  Platelets 150 - 440 K/uL 192 219 234   CMP Latest Ref Rng & Units 04/03/2018 04/02/2018 04/01/2018  Glucose 70 - 99 mg/dL 88 86 97  BUN 8 - 23 mg/dL 11 11 13   Creatinine 0.44 - 1.00 mg/dL 06/01/2018 ) 8.10  Sodium 135 - 145 mmol/L 138 138 138  Potassium 3.5 - 5.1 mmol/L 3.5 3.8 3.9  Chloride 98 - 111 mmol/L 105 106 103  CO2 22 - 32 mmol/L 28 27 28   Calcium 8.9 - 10.3 mg/dL 1.75(Z) 8.3(L) 8.6(L)  Total Protein 6.5 - 8.1 g/dL 6.7 - 7.1  Total Bilirubin 0.3 - 1.2 mg/dL 0.25) - 0.4  Alkaline Phos 38 - 126 U/L 71 - 56  AST 15 - 41 U/L 78(H) - 21  ALT 0 - 44 U/L 78(H) - 13    Imaging studies: No new pertinent imaging studies   Assessment/Plan:  62 y.o.femalewith sigmoid stricture of unknown etiology (inflammatory bowel disease, vs diverticular stricture, vs mass). - SevereMalnutrition:Pre  Albumin in 5.8. Re ordered today. Needs at least a week ofTPNand supplements to improve nutrition enough to have the best surgery possible with deceased risk of complication. (sigmoid colectomy with anastomosis with protective loop ileostomy). Patient continue with TPN and tolerating clear liquid. Patient cannot be advance to full liquid due to pain and to avoid to continue stool formation and accumulation which is already significant. Bowel prep is being given very slowly due to patient discomfort.  - Sat down with patient today again and orient about surgery plan and goals. Patient was oriented that will need end colostomy or loop ileostomy. Explanation of why she will need any of the ostomies was given.  - Anemia of chronic disease. Patient received IV iron therapy. Internal medicine sign off.   - Inflammatory bowel disease markers with elevated Sacchoromyces cerevisiae, IgG.GI refers low risk of inflammatory bowel disease. - Colonoscopy biopsywith focal non specific inflammation. -Internal medicine signed off.  7.7(O, MD

## 2018-04-04 LAB — PREALBUMIN: Prealbumin: 16.4 mg/dL — ABNORMAL LOW (ref 18–38)

## 2018-04-04 LAB — GLUCOSE, CAPILLARY
GLUCOSE-CAPILLARY: 109 mg/dL — AB (ref 70–99)
Glucose-Capillary: 111 mg/dL — ABNORMAL HIGH (ref 70–99)
Glucose-Capillary: 132 mg/dL — ABNORMAL HIGH (ref 70–99)
Glucose-Capillary: 98 mg/dL (ref 70–99)

## 2018-04-04 MED ORDER — TRACE MINERALS CR-CU-MN-SE-ZN 10-1000-500-60 MCG/ML IV SOLN
INTRAVENOUS | Status: AC
  Administered 2018-04-04: 18:00:00 via INTRAVENOUS
  Filled 2018-04-04: qty 1440

## 2018-04-04 MED ORDER — SODIUM CHLORIDE 0.9 % IV SOLN
2.0000 g | INTRAVENOUS | Status: AC
  Administered 2018-04-05: 2 g via INTRAVENOUS
  Filled 2018-04-04 (×2): qty 2

## 2018-04-04 MED ORDER — NEOMYCIN SULFATE 500 MG PO TABS
1000.0000 mg | ORAL_TABLET | ORAL | Status: AC
  Administered 2018-04-04 – 2018-04-05 (×3): 1000 mg via ORAL
  Filled 2018-04-04 (×3): qty 2

## 2018-04-04 MED ORDER — VANCOMYCIN HCL IN DEXTROSE 1-5 GM/200ML-% IV SOLN
1000.0000 mg | Freq: Once | INTRAVENOUS | Status: AC
  Administered 2018-04-04: 1000 mg via INTRAVENOUS
  Filled 2018-04-04: qty 200

## 2018-04-04 MED ORDER — METRONIDAZOLE 500 MG PO TABS
1000.0000 mg | ORAL_TABLET | ORAL | Status: AC
  Administered 2018-04-04 – 2018-04-05 (×3): 1000 mg via ORAL
  Filled 2018-04-04 (×3): qty 2

## 2018-04-04 MED ORDER — VANCOMYCIN HCL IN DEXTROSE 1-5 GM/200ML-% IV SOLN: 1000.0000 mg | Freq: Two times a day (BID) | INTRAVENOUS | Status: DC

## 2018-04-04 MED ORDER — FAT EMULSION PLANT BASED 20 % IV EMUL
250.0000 mL | INTRAVENOUS | Status: AC
  Administered 2018-04-04: 250 mL via INTRAVENOUS
  Filled 2018-04-04: qty 250

## 2018-04-04 MED ORDER — VANCOMYCIN HCL IN DEXTROSE 750-5 MG/150ML-% IV SOLN
750.0000 mg | Freq: Two times a day (BID) | INTRAVENOUS | Status: DC
  Administered 2018-04-05: 750 mg via INTRAVENOUS
  Filled 2018-04-04 (×3): qty 150

## 2018-04-04 NOTE — H&P (View-Only) (Signed)
SURGICAL PROGRESS NOTE   Hospital Day(s): 9.   Post op day(s): 9 Days Post-Op.   Interval History: Patient seen and examined, no acute events or new complaints overnight. Patient reports feeling the same and anxious about surgery tomorrow. Denies nausea or vomiting today. Denies fever or chills.   Vital signs in last 24 hours: [min-max] current  Temp:  [98.4 F (36.9 C)-98.9 F (37.2 C)] 98.6 F (37 C) (09/08 1137) Pulse Rate:  [86-106] 101 (09/08 1137) Resp:  [16-18] 18 (09/08 1137) BP: (81-109)/(51-75) 106/70 (09/08 1137) SpO2:  [93 %-100 %] 100 % (09/08 1137) Weight:  [52.1 kg] 52.1 kg (09/08 0425)     Height: 5\' 4"  (162.6 cm) Weight: 52.1 kg BMI (Calculated): 19.71   Physical Exam:  Constitutional: alert, cooperative and no distress  Respiratory: breathing non-labored at rest  Cardiovascular: regular rate and sinus rhythm  Gastrointestinal: soft, non-tender, and non-distended  Labs:  CBC Latest Ref Rng & Units 04/03/2018 03/29/2018 03/28/2018  WBC 3.6 - 11.0 K/uL 3.6 2.9(L) 3.0(L)  Hemoglobin 12.0 - 16.0 g/dL 8.2(L) 8.9(L) 8.9(L)  Hematocrit 35.0 - 47.0 % 24.9(L) 26.6(L) 26.7(L)  Platelets 150 - 440 K/uL 192 219 234   CMP Latest Ref Rng & Units 04/03/2018 04/02/2018 04/01/2018  Glucose 70 - 99 mg/dL 88 86 97  BUN 8 - 23 mg/dL 11 11 13   Creatinine 0.44 - 1.00 mg/dL 06/01/2018 ) 3.49  Sodium 135 - 145 mmol/L 138 138 138  Potassium 3.5 - 5.1 mmol/L 3.5 3.8 3.9  Chloride 98 - 111 mmol/L 105 106 103  CO2 22 - 32 mmol/L 28 27 28   Calcium 8.9 - 10.3 mg/dL 1.79(X) 8.3(L) 8.6(L)  Total Protein 6.5 - 8.1 g/dL 6.7 - 7.1  Total Bilirubin 0.3 - 1.2 mg/dL 5.05) - 0.4  Alkaline Phos 38 - 126 U/L 71 - 56  AST 15 - 41 U/L 78(H) - 21  ALT 0 - 44 U/L 78(H) - 13    Imaging studies: No new pertinent imaging studies   Assessment/Plan:  62 y.o.femalewith sigmoid stricture of unknown etiology (inflammatory bowel disease, vs diverticular stricture, vs mass). - SevereMalnutrition:Pre  Albumin in 5.8 upon admission. Today increased to 16.4. Expected improved and optimized for surgery tomorrow. Patient continue with TPN and tolerating clear liquid, NPO after midnight. Bowel prep is being given very slowly due to patient discomfort.  - Will start vanco as patient feels warm and there is cellulitis around IV line.  - Anemia of chronic disease. Patient received IV iron therapy. Internal medicine sign off.  - Inflammatory bowel disease markers with elevated Sacchoromyces cerevisiae, IgG.GI refers low risk of inflammatory bowel disease. - Colonoscopy biopsywith focal non specific inflammation. -Internal medicine signed off.  6.9(V, MD

## 2018-04-04 NOTE — Progress Notes (Addendum)
SURGICAL PROGRESS NOTE   Hospital Day(s): 9.   Post op day(s): 9 Days Post-Op.   Interval History: Patient seen and examined, no acute events or new complaints overnight. Patient reports feeling the same and anxious about surgery tomorrow. Denies nausea or vomiting today. Denies fever or chills.   Vital signs in last 24 hours: [min-max] current  Temp:  [98.4 F (36.9 C)-98.9 F (37.2 C)] 98.6 F (37 C) (09/08 1137) Pulse Rate:  [86-106] 101 (09/08 1137) Resp:  [16-18] 18 (09/08 1137) BP: (81-109)/(51-75) 106/70 (09/08 1137) SpO2:  [93 %-100 %] 100 % (09/08 1137) Weight:  [52.1 kg] 52.1 kg (09/08 0425)     Height: 5' 4" (162.6 cm) Weight: 52.1 kg BMI (Calculated): 19.71   Physical Exam:  Constitutional: alert, cooperative and no distress  Respiratory: breathing non-labored at rest  Cardiovascular: regular rate and sinus rhythm  Gastrointestinal: soft, non-tender, and non-distended  Labs:  CBC Latest Ref Rng & Units 04/03/2018 03/29/2018 03/28/2018  WBC 3.6 - 11.0 K/uL 3.6 2.9(L) 3.0(L)  Hemoglobin 12.0 - 16.0 g/dL 8.2(L) 8.9(L) 8.9(L)  Hematocrit 35.0 - 47.0 % 24.9(L) 26.6(L) 26.7(L)  Platelets 150 - 440 K/uL 192 219 234   CMP Latest Ref Rng & Units 04/03/2018 04/02/2018 04/01/2018  Glucose 70 - 99 mg/dL 88 86 97  BUN 8 - 23 mg/dL 11 11 13  Creatinine 0.44 - 1.00 mg/dL 0.61 0.37(L) 0.53  Sodium 135 - 145 mmol/L 138 138 138  Potassium 3.5 - 5.1 mmol/L 3.5 3.8 3.9  Chloride 98 - 111 mmol/L 105 106 103  CO2 22 - 32 mmol/L 28 27 28  Calcium 8.9 - 10.3 mg/dL 8.4(L) 8.3(L) 8.6(L)  Total Protein 6.5 - 8.1 g/dL 6.7 - 7.1  Total Bilirubin 0.3 - 1.2 mg/dL 0.2(L) - 0.4  Alkaline Phos 38 - 126 U/L 71 - 56  AST 15 - 41 U/L 78(H) - 21  ALT 0 - 44 U/L 78(H) - 13    Imaging studies: No new pertinent imaging studies   Assessment/Plan:  62 y.o.femalewith sigmoid stricture of unknown etiology (inflammatory bowel disease, vs diverticular stricture, vs mass). - SevereMalnutrition:Pre  Albumin in 5.8 upon admission. Today increased to 16.4. Expected improved and optimized for surgery tomorrow. Patient continue with TPN and tolerating clear liquid, NPO after midnight. Bowel prep is being given very slowly due to patient discomfort.  - Will start vanco as patient feels warm and there is cellulitis around IV line.  - Anemia of chronic disease. Patient received IV iron therapy. Internal medicine sign off.  - Inflammatory bowel disease markers with elevated Sacchoromyces cerevisiae, IgG.GI refers low risk of inflammatory bowel disease. - Colonoscopy biopsywith focal non specific inflammation. -Internal medicine signed off.  Min Collymore Cintrn-Daz, MD    

## 2018-04-04 NOTE — Progress Notes (Signed)
Pharmacy Antibiotic Note  Morgan Reid is a 62 y.o. female admitted on 03/25/2018 with cellulitis.  Pharmacy has been consulted for vancomycin dosing.  Plan: Vancomycin 750 mg IV every 12 hours beginning 10 hours after first 1000 mg dose. K 0.070, T1/2: 10h, Vd: 36L, calculated concentrations at steady-state: 33.1/15.3 mcg/mL,  Goal trough 15-20 mcg/mL. Vt prior to 4th dose  Height: 5\' 4"  (162.6 cm) Weight: 114 lb 14.4 oz (52.1 kg) IBW/kg (Calculated) : 54.7  Temp (24hrs), Avg:98.6 F (37 C), Min:98.4 F (36.9 C), Max:98.7 F (37.1 C)  Recent Labs  Lab 03/29/18 0504 03/30/18 0531 03/31/18 0424 04/01/18 0522 04/02/18 0536 04/03/18 0549  WBC 2.9*  --   --   --   --  3.6  CREATININE 0.40* 0.31* 0.44 0.53 0.37* 0.61    Estimated Creatinine Clearance: 60 mL/min (by C-G formula based on SCr of 0.61 mg/dL).    No Known Allergies  Antimicrobials this admission: vancomycin 9/8 >>   Thank you for allowing pharmacy to be a part of this patient's care.  11/8, PharmD 04/04/2018 2:48 PM

## 2018-04-04 NOTE — Consult Note (Signed)
PHARMACY - ADULT TOTAL PARENTERAL NUTRITION CONSULT NOTE   Pharmacy Consult for TPN Initiation and Management  Indication: Severe malnutrition  Patient Measurements: Height: 5\' 4"  (162.6 cm) Weight: 114 lb 14.4 oz (52.1 kg) IBW/kg (Calculated) : 54.7 TPN AdjBW (KG): 44.5 Body mass index is 19.72 kg/m.  Assessment: 62 yo female s/p sigmoidoscopy, which found signficant narrowing of sigmoid colon. Pharmacy was consulted to initiate TPN on POD 1 in 62 yo female with severe malnutrition in need of bowel surgery with anastomosis. Patient has had significant weight loss (20-30lbs over a few months) due to sigmoid mass, abdominal pain and possible partial obstruction due to the sigmoid stricture.  GI: GI recommends iron studies due to history of microcytic anemia, says inflammatory bowel disease panel is pending (which will not change course - a stricture biopsy/surgical specimen would me more indicative of crohn's disease). Endo: No Hx of DM, BG 93-112 Insulin requirements in the past 24 hours: 0 unit Lytes: 9/7: K 3.5,Ca 8.4, alb 2.5 CorrCa 9.6 9/7: Albumin 2.5 Renal: Scr: 0.61 mg/dL, CrCl: 11/7  mL/min Hepatobil:  TPN Access: 8/31 @ 1900 TPN start date: 8/31 Nutritional Goals (per RD recommendation):  KCal: 1382 Protein: 72 g Fluid: 1620 mL  Goal TPN rate is: 60 mL/hr    Current Nutrition: TPN (at goal); On clear liquids- per surgery will not advance this because needs surgery.   Plan:  Continue TPN at goal rate Clinimix E 5/15 at 60 mL/hr with ILE 20% 15 ml/hr x 12 hours.   Add MVI, trace elements to TPN daily. Pt has completed thiamine x 3 days.   Patient off SSI (has not required any SSI x 4 days)  Will check labs on 9/9 and continue to monitor electrolytes  11/9 PharmD Clinical Pharmacist 04/04/2018

## 2018-04-04 NOTE — Progress Notes (Signed)
Checked with Lab re blood draw. They have only one phlebotomist circulating for the whole hospital as of this time. Will await results.

## 2018-04-05 ENCOUNTER — Inpatient Hospital Stay: Payer: BC Managed Care – PPO | Admitting: Anesthesiology

## 2018-04-05 ENCOUNTER — Encounter
Admission: AD | Disposition: A | Discharge status: Home / Self Care | Payer: Self-pay | Source: Home / Self Care | Attending: General Surgery

## 2018-04-05 HISTORY — PX: LAPAROSCOPIC SIGMOID COLECTOMY: SHX5928

## 2018-04-05 LAB — CBC
HEMATOCRIT: 25.2 % — AB (ref 35.0–47.0)
HEMOGLOBIN: 8.3 g/dL — AB (ref 12.0–16.0)
MCH: 24.9 pg — ABNORMAL LOW (ref 26.0–34.0)
MCHC: 33.1 g/dL (ref 32.0–36.0)
MCV: 75.2 fL — ABNORMAL LOW (ref 80.0–100.0)
Platelets: 200 10*3/uL (ref 150–440)
RBC: 3.35 MIL/uL — AB (ref 3.80–5.20)
RDW: 17.7 % — ABNORMAL HIGH (ref 11.5–14.5)
WBC: 3.7 10*3/uL (ref 3.6–11.0)

## 2018-04-05 LAB — DIFFERENTIAL
BASOS ABS: 0 10*3/uL (ref 0–0.1)
Basophils Relative: 1 %
EOS ABS: 0.1 10*3/uL (ref 0–0.7)
Eosinophils Relative: 3 %
LYMPHS PCT: 37 %
Lymphs Abs: 1.4 10*3/uL (ref 1.0–3.6)
MONOS PCT: 10 %
Monocytes Absolute: 0.4 10*3/uL (ref 0.2–0.9)
NEUTROS ABS: 1.8 10*3/uL (ref 1.4–6.5)
NEUTROS PCT: 49 %

## 2018-04-05 LAB — COMPREHENSIVE METABOLIC PANEL
ALT: 93 U/L — AB (ref 0–44)
ANION GAP: 7 (ref 5–15)
AST: 71 U/L — ABNORMAL HIGH (ref 15–41)
Albumin: 2.6 g/dL — ABNORMAL LOW (ref 3.5–5.0)
Alkaline Phosphatase: 91 U/L (ref 38–126)
BUN: 13 mg/dL (ref 8–23)
CHLORIDE: 103 mmol/L (ref 98–111)
CO2: 26 mmol/L (ref 22–32)
CREATININE: 0.51 mg/dL (ref 0.44–1.00)
Calcium: 8.3 mg/dL — ABNORMAL LOW (ref 8.9–10.3)
Glucose, Bld: 98 mg/dL (ref 70–99)
POTASSIUM: 3.2 mmol/L — AB (ref 3.5–5.1)
SODIUM: 136 mmol/L (ref 135–145)
Total Bilirubin: 0.3 mg/dL (ref 0.3–1.2)
Total Protein: 6.9 g/dL (ref 6.5–8.1)

## 2018-04-05 LAB — GLUCOSE, CAPILLARY
GLUCOSE-CAPILLARY: 94 mg/dL (ref 70–99)
Glucose-Capillary: 84 mg/dL (ref 70–99)
Glucose-Capillary: 191 mg/dL — ABNORMAL HIGH (ref 70–99)

## 2018-04-05 LAB — PHOSPHORUS: PHOSPHORUS: 4.6 mg/dL (ref 2.5–4.6)

## 2018-04-05 LAB — PREALBUMIN: PREALBUMIN: 20.2 mg/dL (ref 18–38)

## 2018-04-05 LAB — MAGNESIUM: MAGNESIUM: 1.8 mg/dL (ref 1.7–2.4)

## 2018-04-05 LAB — TRIGLYCERIDES: TRIGLYCERIDES: 66 mg/dL (ref <150)

## 2018-04-05 SURGERY — COLECTOMY, SIGMOID, LAPAROSCOPIC
Anesthesia: General | Site: Abdomen | Wound class: "Clean Contaminated "

## 2018-04-05 MED ORDER — PHENYLEPHRINE HCL 10 MG/ML IJ SOLN
INTRAMUSCULAR | Status: DC | PRN
  Administered 2018-04-05 (×5): 100 ug via INTRAVENOUS

## 2018-04-05 MED ORDER — ACETAMINOPHEN 325 MG PO TABS: 325.0000 mg | ORAL_TABLET | ORAL | Status: DC | PRN

## 2018-04-05 MED ORDER — FAT EMULSION PLANT BASED 20 % IV EMUL
180.0000 mL | INTRAVENOUS | Status: AC
  Filled 2018-04-05: qty 250

## 2018-04-05 MED ORDER — BUPIVACAINE LIPOSOME 1.3 % IJ SUSP
INTRAMUSCULAR | Status: AC
  Filled 2018-04-05: qty 20

## 2018-04-05 MED ORDER — BUPIVACAINE HCL (PF) 0.5 % IJ SOLN
INTRAMUSCULAR | Status: AC
  Filled 2018-04-05: qty 30

## 2018-04-05 MED ORDER — FENTANYL CITRATE (PF) 100 MCG/2ML IJ SOLN
INTRAMUSCULAR | Status: AC
  Filled 2018-04-05: qty 2

## 2018-04-05 MED ORDER — MEPERIDINE HCL 50 MG/ML IJ SOLN: 6.2500 mg | INTRAMUSCULAR | Status: DC | PRN

## 2018-04-05 MED ORDER — SUCCINYLCHOLINE CHLORIDE 20 MG/ML IJ SOLN
INTRAMUSCULAR | Status: AC
  Filled 2018-04-05: qty 1

## 2018-04-05 MED ORDER — ACETAMINOPHEN 500 MG PO TABS
ORAL_TABLET | ORAL | Status: AC
  Filled 2018-04-05: qty 2

## 2018-04-05 MED ORDER — POTASSIUM CHLORIDE 10 MEQ/100ML IV SOLN
10.0000 meq | INTRAVENOUS | Status: AC
  Administered 2018-04-05 (×3): 10 meq via INTRAVENOUS
  Filled 2018-04-05 (×2): qty 100

## 2018-04-05 MED ORDER — ROCURONIUM BROMIDE 50 MG/5ML IV SOLN
INTRAVENOUS | Status: AC
  Filled 2018-04-05: qty 1

## 2018-04-05 MED ORDER — BUPIVACAINE LIPOSOME 1.3 % IJ SUSP
INTRAMUSCULAR | Status: DC | PRN
  Administered 2018-04-05: 20 mL

## 2018-04-05 MED ORDER — MIDAZOLAM HCL 2 MG/2ML IJ SOLN
INTRAMUSCULAR | Status: DC | PRN
  Administered 2018-04-05: 2 mg via INTRAVENOUS

## 2018-04-05 MED ORDER — PROMETHAZINE HCL 25 MG/ML IJ SOLN: 6.2500 mg | INTRAMUSCULAR | Status: DC | PRN

## 2018-04-05 MED ORDER — SUGAMMADEX SODIUM 200 MG/2ML IV SOLN
INTRAVENOUS | Status: AC
  Filled 2018-04-05: qty 2

## 2018-04-05 MED ORDER — ACETAMINOPHEN 160 MG/5ML PO SOLN
325.0000 mg | ORAL | Status: DC | PRN
  Filled 2018-04-05: qty 20.3

## 2018-04-05 MED ORDER — HYDROMORPHONE HCL 1 MG/ML IJ SOLN
INTRAMUSCULAR | Status: AC
  Administered 2018-04-05: 0.5 mg via INTRAVENOUS
  Filled 2018-04-05: qty 1

## 2018-04-05 MED ORDER — ROCURONIUM BROMIDE 100 MG/10ML IV SOLN
INTRAVENOUS | Status: DC | PRN
  Administered 2018-04-05 (×2): 10 mg via INTRAVENOUS
  Administered 2018-04-05: 30 mg via INTRAVENOUS
  Administered 2018-04-05 (×3): 10 mg via INTRAVENOUS

## 2018-04-05 MED ORDER — ONDANSETRON HCL 4 MG/2ML IJ SOLN
INTRAMUSCULAR | Status: DC | PRN
  Administered 2018-04-05: 4 mg via INTRAVENOUS

## 2018-04-05 MED ORDER — SODIUM CHLORIDE 0.9 % IJ SOLN
INTRAMUSCULAR | Status: DC | PRN
  Administered 2018-04-05: 50 mL via INTRAVENOUS

## 2018-04-05 MED ORDER — SODIUM CHLORIDE 0.9 % IJ SOLN
INTRAMUSCULAR | Status: AC
  Filled 2018-04-05: qty 50

## 2018-04-05 MED ORDER — LIDOCAINE HCL (CARDIAC) PF 100 MG/5ML IV SOSY
PREFILLED_SYRINGE | INTRAVENOUS | Status: DC | PRN
  Administered 2018-04-05: 40 mg via INTRAVENOUS

## 2018-04-05 MED ORDER — ACETAMINOPHEN 500 MG PO TABS
1000.0000 mg | ORAL_TABLET | ORAL | Status: AC
  Administered 2018-04-05: 1000 mg via ORAL

## 2018-04-05 MED ORDER — TRACE MINERALS CR-CU-MN-SE-ZN 10-1000-500-60 MCG/ML IV SOLN
INTRAVENOUS | Status: AC
  Administered 2018-04-06: 04:00:00 via INTRAVENOUS
  Filled 2018-04-05: qty 1440

## 2018-04-05 MED ORDER — ONDANSETRON HCL 4 MG/2ML IJ SOLN: 4.0000 mg | Freq: Once | INTRAMUSCULAR | Status: DC | PRN

## 2018-04-05 MED ORDER — GABAPENTIN 300 MG PO CAPS
300.0000 mg | ORAL_CAPSULE | Freq: Two times a day (BID) | ORAL | Status: DC
  Filled 2018-04-05 (×4): qty 1

## 2018-04-05 MED ORDER — FENTANYL CITRATE (PF) 100 MCG/2ML IJ SOLN: 25.0000 ug | INTRAMUSCULAR | Status: DC | PRN

## 2018-04-05 MED ORDER — HYDROMORPHONE HCL 1 MG/ML IJ SOLN
0.2500 mg | INTRAMUSCULAR | Status: DC | PRN
  Administered 2018-04-05 (×2): 0.5 mg via INTRAVENOUS

## 2018-04-05 MED ORDER — BUPIVACAINE HCL 0.5 % IJ SOLN
INTRAMUSCULAR | Status: DC | PRN
  Administered 2018-04-05: 30 mL

## 2018-04-05 MED ORDER — SUGAMMADEX SODIUM 200 MG/2ML IV SOLN
INTRAVENOUS | Status: DC | PRN
  Administered 2018-04-05: 100 mg via INTRAVENOUS

## 2018-04-05 MED ORDER — MIDAZOLAM HCL 2 MG/2ML IJ SOLN
INTRAMUSCULAR | Status: AC
  Filled 2018-04-05: qty 2

## 2018-04-05 MED ORDER — DEXAMETHASONE SODIUM PHOSPHATE 10 MG/ML IJ SOLN
INTRAMUSCULAR | Status: DC | PRN
  Administered 2018-04-05: 10 mg via INTRAVENOUS

## 2018-04-05 MED ORDER — VANCOMYCIN HCL IN DEXTROSE 750-5 MG/150ML-% IV SOLN
750.0000 mg | INTRAVENOUS | Status: DC
  Administered 2018-04-06 – 2018-04-07 (×2): 750 mg via INTRAVENOUS
  Filled 2018-04-05 (×4): qty 150

## 2018-04-05 MED ORDER — HYDROCODONE-ACETAMINOPHEN 7.5-325 MG PO TABS: 1.0000 | ORAL_TABLET | Freq: Once | ORAL | Status: DC | PRN

## 2018-04-05 MED ORDER — FENTANYL CITRATE (PF) 100 MCG/2ML IJ SOLN
INTRAMUSCULAR | Status: DC | PRN
  Administered 2018-04-05: 50 ug via INTRAVENOUS
  Administered 2018-04-05: 12.5 ug via INTRAVENOUS
  Administered 2018-04-05 (×7): 25 ug via INTRAVENOUS

## 2018-04-05 MED ORDER — PROPOFOL 10 MG/ML IV BOLUS
INTRAVENOUS | Status: DC | PRN
  Administered 2018-04-05: 100 mg via INTRAVENOUS

## 2018-04-05 MED ORDER — PROPOFOL 10 MG/ML IV BOLUS
INTRAVENOUS | Status: AC
  Filled 2018-04-05: qty 20

## 2018-04-05 MED ORDER — SODIUM CHLORIDE 0.9 % IV SOLN
2.0000 g | Freq: Once | INTRAVENOUS | Status: AC
  Administered 2018-04-05: 2 g via INTRAVENOUS
  Filled 2018-04-05: qty 2

## 2018-04-05 MED ORDER — CHLORHEXIDINE GLUCONATE 4 % EX LIQD: 1.0000 "application " | Freq: Once | CUTANEOUS | Status: DC

## 2018-04-05 SURGICAL SUPPLY — 110 items
"PENCIL ELECTRO HAND CTR " (MISCELLANEOUS) ×1 IMPLANT
APPLIER CLIP ROT 10 11.4 M/L (STAPLE)
BAG COUNTER SPONGE EZ (MISCELLANEOUS) ×3 IMPLANT
BAG DECANTER FOR FLEXI CONT (MISCELLANEOUS) ×1 IMPLANT
BLADE CLIPPER SURG (BLADE) ×3 IMPLANT
BLADE SURG SZ10 CARB STEEL (BLADE) ×4 IMPLANT
BLADE SURG SZ11 CARB STEEL (BLADE) ×4 IMPLANT
CANISTER SUCT 1200ML W/VALVE (MISCELLANEOUS) ×4 IMPLANT
CANNULA DILATOR 10 W/SLV (CANNULA) ×3 IMPLANT
CANNULA DILATOR 10MM W/SLV (CANNULA) ×1
CATH ROBINSON RED A/P 16FR (CATHETERS) ×3 IMPLANT
CHLORAPREP W/TINT 26ML (MISCELLANEOUS) ×4 IMPLANT
CLIP APPLIE ROT 10 11.4 M/L (STAPLE) ×1 IMPLANT
CLOSURE WOUND 1/2 X4 (GAUZE/BANDAGES/DRESSINGS) ×2
COUNTER SPONGE BAG EZ (MISCELLANEOUS) ×1
COVER CLAMP SIL LG PBX B (MISCELLANEOUS) ×4 IMPLANT
COVER LIGHT HANDLE STERIS (MISCELLANEOUS) ×3 IMPLANT
DEFOGGER SCOPE WARMER CLEARIFY (MISCELLANEOUS) ×4 IMPLANT
DERMABOND ADVANCED (GAUZE/BANDAGES/DRESSINGS) ×2
DERMABOND ADVANCED .7 DNX12 (GAUZE/BANDAGES/DRESSINGS) ×3 IMPLANT
DRAPE LAPAROTOMY 100X77 ABD (DRAPES) ×4 IMPLANT
DRAPE LEGGINS SURG 28X43 STRL (DRAPES) ×4 IMPLANT
DRAPE UNDER BUTTOCK W/FLU (DRAPES) ×4 IMPLANT
DRSG OPSITE POSTOP 4X8 (GAUZE/BANDAGES/DRESSINGS) ×3 IMPLANT
DRSG TEGADERM 2-3/8X2-3/4 SM (GAUZE/BANDAGES/DRESSINGS) ×8 IMPLANT
DRSG TEGADERM 4X4.75 (GAUZE/BANDAGES/DRESSINGS) ×8 IMPLANT
DRSG TELFA 3X8 NADH (GAUZE/BANDAGES/DRESSINGS) ×4 IMPLANT
ELECT BLADE 6 FLAT ULTRCLN (ELECTRODE) ×4 IMPLANT
ELECT BLADE 6.5 EXT (BLADE) ×4 IMPLANT
ELECT REM PT RETURN 9FT ADLT (ELECTROSURGICAL) ×4
ELECTRODE REM PT RTRN 9FT ADLT (ELECTROSURGICAL) ×2 IMPLANT
GAUZE STRETCH 2X75IN STRL (MISCELLANEOUS) ×4 IMPLANT
GLOVE BIO SURGEON STRL SZ 6.5 (GLOVE) ×9 IMPLANT
GLOVE BIO SURGEONS STRL SZ 6.5 (GLOVE) ×3
GLOVE INDICATOR 6.5 STRL GRN (GLOVE) ×12 IMPLANT
GLOVE INDICATOR 8.0 STRL GRN (GLOVE) ×12 IMPLANT
GLOVE SURG SYN 6.5 ES PF (GLOVE) ×16 IMPLANT
GLOVE SURG SYN 6.5 PF PI (GLOVE) IMPLANT
GOWN STRL REUS W/ TWL LRG LVL3 (GOWN DISPOSABLE) ×2 IMPLANT
GOWN STRL REUS W/TWL LRG LVL3 (GOWN DISPOSABLE) ×2
GOWN STRL REUS W/TWL LRG LVL4 (GOWN DISPOSABLE) ×16 IMPLANT
HANDLE YANKAUER SUCT BULB TIP (MISCELLANEOUS) ×4 IMPLANT
IRRIGATION STRYKERFLOW (MISCELLANEOUS) ×2 IMPLANT
IRRIGATOR STRYKERFLOW (MISCELLANEOUS) ×4
IV NS 1000ML (IV SOLUTION) ×4
IV NS 1000ML BAXH (IV SOLUTION) ×3 IMPLANT
KIT TURNOVER KIT A (KITS) ×4 IMPLANT
LABEL OR SOLS (LABEL) ×4 IMPLANT
NEEDLE HYPO 22GX1.5 SAFETY (NEEDLE) ×4 IMPLANT
NS IRRIG 1000ML POUR BTL (IV SOLUTION) ×4 IMPLANT
NS IRRIG 500ML POUR BTL (IV SOLUTION) ×4 IMPLANT
PACK BASIN MAJOR ARMC (MISCELLANEOUS) ×4 IMPLANT
PACK BASIN MINOR ARMC (MISCELLANEOUS) ×4 IMPLANT
PACK COLON CLEAN CLOSURE (MISCELLANEOUS) ×4 IMPLANT
PACK LAP CHOLECYSTECTOMY (MISCELLANEOUS) ×4 IMPLANT
PAD DRESSING TELFA 3X8 NADH (GAUZE/BANDAGES/DRESSINGS) ×1 IMPLANT
PENCIL ELECTRO HAND CTR (MISCELLANEOUS) ×4 IMPLANT
RELOAD STAPLE 60 3.6 BLU REG (STAPLE) ×1 IMPLANT
RELOAD STAPLER BLUE 60MM (STAPLE) ×4 IMPLANT
SCALPEL PROTECTED #11 DISP (BLADE) ×4 IMPLANT
SCISSORS METZENBAUM CVD 33 (INSTRUMENTS) ×1 IMPLANT
SEAL FOR SCOPE WARMER C3101 (MISCELLANEOUS) ×1 IMPLANT
SET YANKAUER POOLE SUCT (MISCELLANEOUS) ×4 IMPLANT
SHEARS HARMONIC 9CM CVD (BLADE) ×4 IMPLANT
SHEARS HARMONIC ACE PLUS 36CM (ENDOMECHANICALS) ×4 IMPLANT
SLEEVE ENDOPATH XCEL 5M (ENDOMECHANICALS) ×4 IMPLANT
SOL PREP PVP 2OZ (MISCELLANEOUS) ×4
SOLUTION PREP PVP 2OZ (MISCELLANEOUS) ×2 IMPLANT
SPONGE LAP 18X18 RF (DISPOSABLE) ×4 IMPLANT
STAPLE ECHEON FLEX 60 POW ENDO (STAPLE) ×4 IMPLANT
STAPLER CIRCULAR 29MM (STAPLE) ×3 IMPLANT
STAPLER ENDO ILS CVD 18 33 (STAPLE) IMPLANT
STAPLER PROX 25M (MISCELLANEOUS) IMPLANT
STAPLER RELOAD BLUE 60MM (STAPLE) ×8
STAPLER SKIN PROX 35W (STAPLE) ×1 IMPLANT
STRIP CLOSURE SKIN 1/2X4 (GAUZE/BANDAGES/DRESSINGS) ×6 IMPLANT
SUT MNCRL 4-0 (SUTURE) ×2
SUT MNCRL 4-0 27XMFL (SUTURE) ×2
SUT MNCRL AB 4-0 PS2 18 (SUTURE) ×2 IMPLANT
SUT PDS AB 0 CT1 27 (SUTURE) ×2 IMPLANT
SUT PROLENE 0 CT 1 30 (SUTURE) ×1 IMPLANT
SUT PROLENE 2 0 SH DA (SUTURE) ×4 IMPLANT
SUT SILK 2 0 (SUTURE)
SUT SILK 2 0SH CR/8 30 (SUTURE) ×1 IMPLANT
SUT SILK 2-0 (SUTURE) ×1 IMPLANT
SUT SILK 2-0 18XBRD TIE 12 (SUTURE) ×1 IMPLANT
SUT SILK 3-0 (SUTURE) ×3 IMPLANT
SUT VIC AB 0 SH 27 (SUTURE) ×1 IMPLANT
SUT VIC AB 2-0 BRD 54 (SUTURE) ×1 IMPLANT
SUT VIC AB 2-0 SH 27 (SUTURE) ×2
SUT VIC AB 2-0 SH 27XBRD (SUTURE) ×1 IMPLANT
SUT VIC AB 3-0 SH 27 (SUTURE) ×2
SUT VIC AB 3-0 SH 27X BRD (SUTURE) ×2 IMPLANT
SUT VIC AB 4-0 FS2 27 (SUTURE) ×1 IMPLANT
SUT VICRYL 0 AB UR-6 (SUTURE) ×3 IMPLANT
SUT VICRYL+ 3-0 144IN (SUTURE) ×1 IMPLANT
SUTURE MNCRL 4-0 27XMF (SUTURE) ×2 IMPLANT
SWABSTK COMLB BENZOIN TINCTURE (MISCELLANEOUS) ×4 IMPLANT
SYR 20CC LL (SYRINGE) ×8 IMPLANT
SYR 50ML LL SCALE MARK (SYRINGE) ×4 IMPLANT
SYR BULB IRRIG 60ML STRL (SYRINGE) ×3 IMPLANT
SYS LAPSCP GELPORT 120MM (MISCELLANEOUS) ×4
SYSTEM LAPSCP GELPORT 120MM (MISCELLANEOUS) ×2 IMPLANT
TOWEL OR 17X26 4PK STRL BLUE (TOWEL DISPOSABLE) ×4 IMPLANT
TRAY FOLEY MTR SLVR 16FR STAT (SET/KITS/TRAYS/PACK) ×4 IMPLANT
TROCAR XCEL 12X100 BLDLESS (ENDOMECHANICALS) ×4 IMPLANT
TROCAR XCEL NON-BLD 11X100MML (ENDOMECHANICALS) ×4 IMPLANT
TROCAR XCEL NON-BLD 5MMX100MML (ENDOMECHANICALS) ×4 IMPLANT
TUBING INSUF HEATED (TUBING) ×1 IMPLANT
TUBING INSUFFLATION (TUBING) ×4 IMPLANT

## 2018-04-05 NOTE — Consult Note (Signed)
PHARMACY - ADULT TOTAL PARENTERAL NUTRITION CONSULT NOTE   Pharmacy Consult for TPN Initiation and Management  Indication: Severe malnutrition  Patient Measurements: Height: 5\' 4"  (162.6 cm) Weight: 106 lb 11.2 oz (48.4 kg) IBW/kg (Calculated) : 54.7 TPN AdjBW (KG): 44.5 Body mass index is 18.32 kg/m.  Assessment: 62 yo female s/p sigmoidoscopy, which found signficant narrowing of sigmoid colon. Pharmacy was consulted to initiate TPN on POD 1 in 62 yo female with severe malnutrition in need of bowel surgery with anastomosis. Patient has had significant weight loss (20-30lbs over a few months) due to sigmoid mass, abdominal pain and possible partial obstruction due to the sigmoid stricture. Plans for surgery on 9/9.  GI: GI recommends iron studies due to history of microcytic anemia, says inflammatory bowel disease panel is pending (which will not change course - a stricture biopsy/surgical specimen would me more indicative of crohn's disease). Endo: No Hx of DM, BG 84-132 Insulin requirements in the past 24 hours: 0 unit - none ordered Lytes: 9/9: K 3.2, Mag 1.8, Phos 4.6, Ca 8.3, alb 2.6, CorrCa 9.4 Renal: Scr: 0.51 mg/dL, CrCl: 11/9  mL/min  TPN Access: 8/31 @ 1900 TPN start date: 8/31 Nutritional Goals (per RD recommendation):  KCal: 1382 Protein: 72 g Fluid: 1620 mL  Goal TPN rate is: 60 mL/hr    Current Nutrition: TPN (at goal); On clear liquids- per surgery will not advance this because needs surgery.   Plan:  Continue TPN at goal rate Clinimix E 5/15 at 60 mL/hr with ILE 20% 15 ml/hr x 12 hours.   Add MVI, trace elements to TPN daily. Pt has completed thiamine x 3 days.   K low today at 3.2 - will order KCl 10 mEq IV x3 runs  Patient off SSI (has not required any SSI x 4 days)  Will check labs on 9/10 and continue to monitor electrolytes  Pharmacy will continue to follow.  11/10, PharmD, BCPS Clinical Pharmacist 04/05/2018 8:19 AM

## 2018-04-05 NOTE — Anesthesia Postprocedure Evaluation (Signed)
Anesthesia Post Note  Patient: Morgan Reid  Procedure(s) Performed: LAPAROSCOPIC SIGMOID COLECTOMY (N/A Abdomen)  Patient location during evaluation: PACU Anesthesia Type: General Level of consciousness: awake and alert Pain management: pain level controlled Vital Signs Assessment: post-procedure vital signs reviewed and stable Respiratory status: spontaneous breathing, nonlabored ventilation, respiratory function stable and patient connected to nasal cannula oxygen Cardiovascular status: blood pressure returned to baseline and stable Postop Assessment: no apparent nausea or vomiting Anesthetic complications: no     Last Vitals:  Vitals:   04/05/18 2145 04/05/18 2150  BP: 111/71   Pulse: 90 88  Resp: 19 17  Temp:    SpO2: 100% 100%    Last Pain:  Vitals:   04/05/18 2150  TempSrc:   PainSc: 6                  Cleda Mccreedy Piscitello

## 2018-04-05 NOTE — Op Note (Signed)
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 .   Procedure: Laparoscopic Sigmoidectomy with laparoscopic mobilization of splenic flexure and primary anastomosis (coloproctostomy).  Anesthesia: GETA  Surgeon: Dr. Hazle Quant, MD  Assistant Surgeon: Dr. Tonna Boehringer (due to complexity of case, significant sigmoid adhere to wall, need of exposure and EEA)  Wound Classification: Clean Contaminated  Indications:  Patient is a 62 y.o. female with sigmoid stricture and partial large bowel obstruction. Patient with chronic pain due to stricture, Optimized with nutrition and adequate bowel preparation. Elective resection was indicated.   Findings: 1. Significant thickening and chronic inflammation of the sigmoid colon severely adhere to the left lower abdominal wall.  2. Both ureter identified and preserved 3. Tension free anastomosis 4. No purulent secretions. Adequate bowel prep. No proximal large bowel dilation.  5. Adequate blood supply to anastomosis appreciated 6. EEA 29 mm used for anastomosis 7. Adequate Hemostasis  Description of procedure: The patient was placed in the supine position and general endotracheal anesthesia was induced. Preoperative antibiotics were given. A Foley catheter, orogastric tube, and sequential pneumatic compression device were placed. The abdomen was prepped and draped in the usual sterile fashion. A time-out was completed verifying correct patient, procedure, site, positioning, and implant(s) and/or special equipment prior to beginning this procedure.   An open approach was used to access the peritoneal cavity. A 5cm incision was made periumbilical on midline. The subcutaneous tissue was dissected all the way down to the fascia. The fascia was opened using the electrocautery vertically. The peritoneum was then incised and the Gelport was placed under  direct vision.  A 5-mm trocar was then placed under direct vision in the suprapubic area and an additional 12-mm trocars one on the right lower quadrant.  The patient was then positioned in 30 Trendelenburg and with the patient's left side up to allow for small bowel to drop out of the operative field. Using a hand assisted technique the sigmoid was grasped with the surgen's left hand lifted up to tent the IMA. The lateral mobilization of the colon was then carried out by dividing any lateral attachments of the descending in sigmoid colon as well as dividing the white line of Told. The sigmoid colon was found to be severely adhere to the left abdominal wall and was slowly and carefully dissected after the ureter was identified. The dissection was then continued in the avascular plane anterior to the Toldt fascia cephalad to the level of the ligament of Treitz and distally to the level of the peritoneal reflection taking care to preserve the left ureter throughout its course. The mobilization of the splenic flexure was performed to allow sufficient length of colon to perform a tension-free anastomosis.  Dissection progressed along the white line of Toldt with subsequent division of omental attachments to distal transverse colon and the splenic felxure takedown was completed from lateral to medial. The dissection was continued distally to the level of the rectosigmoid junction at the level of the peritoneal reflection and the mesentery of the colon at that level was dissected with Harmonic scapel including division of the superior hemorrhoidal vessels. The colon at that level was transected with an laparoscopic linear stapler 60-mm stapling device. The mesentery was divided with Harmonic scalpel. The specimen was delivered through the midline incision. Completion of the mesenteric transection proximately was done. Care was taken to ensure grossly negative margins and removal of all of the mesentery of the left  colon for a proper lymphadenectomy. The colon was transected proximally. A running over and over 2-0 prolene suture was used to tighten the proximal colon onto the appropriate sized anvil.  The colon and the anvil were then reintroduced back into the peritoneal cavity. Reinsufflation of the abdominal cavity was carried out. The EEA stapler was then introduced into the anus following gentle dilatation and appropriate lubrication. It was guided under direct vision up to the level of the distal staple line on the rectal stump. The spike of the EEA device was then deployed to pierce the rectal stump, the anvil was then attached to this spike, and the EEA device is closed and fired to perform a stapled end-to-end anastomosis. The doughnuts produced by the EEA stapler were checked to ensure that they were complete. Furthermore, an air leak test was carried out by insufflating air gently into the rectum, while the anastomosis was bathed underwater. A clamp was placed on the proximal colon to prevent its distention. Once the anastomosis was properly tested, the patient was repositioned back in the normal anatomical position. The trocars were removed under direct vision.  After changing gowns and gloves, the midline fascia was closed with a number 0 PDS suture. The skin was closed with staples and a dry sterile dressing is applied. The sponge and instrument count were correct, blood loss was minimal, and there were no complications. The patient tolerated the procedure well and was transferred to the post-anesthesia care unit in stable condition.   Specimen: Sigmoid colon  Complications: None  Estimated Blood Loss: 50 mL

## 2018-04-05 NOTE — Progress Notes (Signed)
   04/05/18 1300  Clinical Encounter Type  Visited With Patient  Visit Type Follow-up;Spiritual support;Pre-op  Recommendations  (Continued follow-up.)  Spiritual Encounters  Spiritual Needs Emotional;Prayer  Stress Factors  Patient Stress Factors Health changes (Uncertain about the future.)   Chaplain initially visited the patient on 2C-211 to learn the patient was in surgery. Chaplain went to Pre-Op and found the patient. Chaplain offered emotional support, prayer, and a calming presence. The patient was nervous (twitching cheek) and eager to get the surgery over with. Chaplain helped the patient to calm down and focus on prayer. FYI, the patient hopes to go home 04/06/2018 by 6pm.

## 2018-04-05 NOTE — Plan of Care (Signed)
Remains NPO for procedure,will continue planned regimen.Callbell within reach.

## 2018-04-05 NOTE — Anesthesia Preprocedure Evaluation (Addendum)
Anesthesia Evaluation  Patient identified by MRN, date of birth, ID band Patient awake    Reviewed: Allergy & Precautions, H&P , NPO status , reviewed documented beta blocker date and time   Airway Mallampati: II  TM Distance: >3 FB Neck ROM: full    Dental  (+) Upper Dentures, Lower Dentures   Pulmonary former smoker,    Pulmonary exam normal        Cardiovascular Normal cardiovascular exam     Neuro/Psych    GI/Hepatic neg GERD  ,  Endo/Other    Renal/GU      Musculoskeletal   Abdominal   Peds  Hematology  (+) anemia ,   Anesthesia Other Findings Past Medical History: No date: Anemia No date: Arthralgia No date: Arthralgia No date: Collagen vascular disease (HCC)     Comment:  Rheumatoid Arthritis No date: Connective tissue disease (HCC)     Comment:  a. S/p Methotrexate. b. Plaquenil. c. Prednisone.  No date: Connective tissue disease (HCC)     Comment:  S/P a.Methotrexate, b. Plaquenil c. Prednisone Past Surgical History: No date: COLONOSCOPY     Comment:  poor prep, rescheduled for 02/03/2018 03/25/2018: COLONOSCOPY WITH PROPOFOL; N/A     Comment:  Procedure: COLONOSCOPY WITH PROPOFOL;  Surgeon:               [name], MD;  Location: [facility] ENDOSCOPY;                Service: Endoscopy;  Laterality: N/A; 03/26/2018: COLONOSCOPY WITH PROPOFOL; N/A     Comment:  Procedure: COLONOSCOPY WITH PROPOFOL;  Surgeon:               [name], MD;  Location: [facility] ENDOSCOPY;                Service: Endoscopy;  Laterality: N/A; 03/25/2018: ESOPHAGOGASTRODUODENOSCOPY (EGD) WITH PROPOFOL; N/A     Comment:  Procedure: ESOPHAGOGASTRODUODENOSCOPY (EGD) WITH               PROPOFOL;  Surgeon: [name], MD;  Location:               [facility] ENDOSCOPY;  Service: Endoscopy;  Laterality: N/A; BMI    Body Mass Index:  18.32 kg/m     Reproductive/Obstetrics                             Anesthesia Physical Anesthesia Plan  ASA: III  Anesthesia Plan: General ETT   Post-op Pain Management:    Induction: Intravenous  PONV Risk Score and Plan: Ondansetron, Treatment may vary due to age or medical condition, Midazolam and Dexamethasone  Airway Management Planned: Oral ETT  Additional Equipment:   Intra-op Plan:   Post-operative Plan: Extubation in OR  Informed Consent: I have reviewed the patients History and Physical, chart, labs and discussed the procedure including the risks, benefits and alternatives for the proposed anesthesia with the patient or authorized representative who has indicated his/her understanding and acceptance.   Dental Advisory Given  Plan Discussed with: CRNA  Anesthesia Plan Comments:         Anesthesia Quick Evaluation

## 2018-04-05 NOTE — Anesthesia Procedure Notes (Signed)
Procedure Name: Intubation Date/Time: 04/05/2018 3:19 PM Performed by: Henrietta Hoover, CRNA Pre-anesthesia Checklist: Patient identified, Patient being monitored, Timeout performed, Emergency Drugs available and Suction available Patient Re-evaluated:Patient Re-evaluated prior to induction Oxygen Delivery Method: Circle system utilized Preoxygenation: Pre-oxygenation with 100% oxygen Induction Type: IV induction Ventilation: Mask ventilation without difficulty Laryngoscope Size: 3 and McGraph Grade View: Grade I Tube type: Oral Tube size: 7.0 mm Number of attempts: 1 Airway Equipment and Method: Stylet Placement Confirmation: ETT inserted through vocal cords under direct vision,  positive ETCO2 and breath sounds checked- equal and bilateral Secured at: 21 cm Tube secured with: Tape Dental Injury: Teeth and Oropharynx as per pre-operative assessment

## 2018-04-05 NOTE — Transfer of Care (Signed)
Immediate Anesthesia Transfer of Care Note  Patient: Morgan Reid  Procedure(s) Performed: LAPAROSCOPIC SIGMOID COLECTOMY (N/A Abdomen)  Patient Location: PACU  Anesthesia Type:General  Level of Consciousness: sedated and patient cooperative  Airway & Oxygen Therapy: Patient Spontanous Breathing and Patient connected to face mask oxygen  Post-op Assessment: Report given to RN and Post -op Vital signs reviewed and stable  Post vital signs: Reviewed and stable  Last Vitals:  Vitals Value Taken Time  BP 116/77 04/05/2018  9:12 PM  Temp    Pulse 95 04/05/2018  9:12 PM  Resp 16 04/05/2018  9:12 PM  SpO2 100 % 04/05/2018  9:12 PM  Vitals shown include unvalidated device data.  Last Pain:  Vitals:   04/05/18 1315  TempSrc: Temporal  PainSc: 0-No pain      Patients Stated Pain Goal: 0 (03/29/18 1712)  Complications: No apparent anesthesia complications

## 2018-04-05 NOTE — Interval H&P Note (Signed)
History and Physical Interval Note:  04/05/2018 2:09 PM  Morgan Reid  has presented today for surgery, with the diagnosis of N/A  The various methods of treatment have been discussed with the patient and family. After consideration of risks, benefits and other options for treatment, the patient has consented to  Procedure(s): LAPAROSCOPIC SIGMOID COLECTOMY (N/A) COLOSTOMY (N/A) with possible ostomy creation as a surgical intervention .  The patient's history has been reviewed, patient examined, no change in status, stable for surgery.  I have reviewed the patient's chart and labs.  Questions were answered to the patient's satisfaction.     Carolan Shiver

## 2018-04-05 NOTE — Anesthesia Post-op Follow-up Note (Signed)
Anesthesia QCDR form completed.        

## 2018-04-05 NOTE — Progress Notes (Signed)
Pt tx to OR in bed.

## 2018-04-05 NOTE — Progress Notes (Signed)
Pharmacy Antibiotic Note  Morgan Reid is a 62 y.o. female admitted on 03/25/2018 with cellulitis.  Pharmacy has been consulted for vancomycin dosing.  Plan: Current orders for Vancomycin 750 mg IV every 12 hours beginning 10 hours after first 1000 mg dose.   Change in weight from 52.1 kg to 48.4 kg Ke 0.051, half life 13.6 h, Vd 34 L Will adjust dose to vancomycin 750 mg IV q18h.  Goal trough 15-20 mcg/mL. Vt prior to 4th dose  Height: 5\' 4"  (162.6 cm) Weight: 106 lb 11.2 oz (48.4 kg) IBW/kg (Calculated) : 54.7  Temp (24hrs), Avg:98.3 F (36.8 C), Min:98.2 F (36.8 C), Max:98.6 F (37 C)  Recent Labs  Lab 03/31/18 0424 04/01/18 0522 04/02/18 0536 04/03/18 0549 04/05/18 0600  WBC  --   --   --  3.6 3.7  CREATININE 0.44 0.53 0.37* 0.61 0.51    Estimated Creatinine Clearance: 55.7 mL/min (by C-G formula based on SCr of 0.51 mg/dL).    No Known Allergies  Antimicrobials this admission: vancomycin 9/8 >>   No active cultures at this time  Thank you for allowing pharmacy to be a part of this patient's care.  11/8, PharmD 04/05/2018 8:29 AM

## 2018-04-06 ENCOUNTER — Encounter: Payer: Self-pay | Admitting: General Surgery

## 2018-04-06 LAB — CBC WITH DIFFERENTIAL/PLATELET
BASOS ABS: 0 10*3/uL (ref 0–0.1)
Basophils Relative: 0 %
Eosinophils Absolute: 0 10*3/uL (ref 0–0.7)
Eosinophils Relative: 0 %
HEMATOCRIT: 25.5 % — AB (ref 35.0–47.0)
Hemoglobin: 8.2 g/dL — ABNORMAL LOW (ref 12.0–16.0)
LYMPHS ABS: 0.3 10*3/uL — AB (ref 1.0–3.6)
LYMPHS PCT: 3 %
MCH: 24.4 pg — ABNORMAL LOW (ref 26.0–34.0)
MCHC: 32 g/dL (ref 32.0–36.0)
MCV: 76.4 fL — AB (ref 80.0–100.0)
MONO ABS: 0.3 10*3/uL (ref 0.2–0.9)
MONOS PCT: 2 %
NEUTROS ABS: 11.8 10*3/uL — AB (ref 1.4–6.5)
Neutrophils Relative %: 95 %
Platelets: 201 10*3/uL (ref 150–440)
RBC: 3.34 MIL/uL — ABNORMAL LOW (ref 3.80–5.20)
RDW: 17.4 % — ABNORMAL HIGH (ref 11.5–14.5)
WBC: 12.4 10*3/uL — ABNORMAL HIGH (ref 3.6–11.0)

## 2018-04-06 LAB — COMPREHENSIVE METABOLIC PANEL
ALT: 67 U/L — ABNORMAL HIGH (ref 0–44)
AST: 44 U/L — AB (ref 15–41)
Albumin: 2.3 g/dL — ABNORMAL LOW (ref 3.5–5.0)
Alkaline Phosphatase: 75 U/L (ref 38–126)
Anion gap: 5 (ref 5–15)
BILIRUBIN TOTAL: 0.4 mg/dL (ref 0.3–1.2)
BUN: 13 mg/dL (ref 8–23)
CO2: 23 mmol/L (ref 22–32)
Calcium: 8.1 mg/dL — ABNORMAL LOW (ref 8.9–10.3)
Chloride: 107 mmol/L (ref 98–111)
Creatinine, Ser: 0.57 mg/dL (ref 0.44–1.00)
GFR calc Af Amer: >60 mL/min (ref >60)
Glucose, Bld: 232 mg/dL — ABNORMAL HIGH (ref 70–99)
POTASSIUM: 3.8 mmol/L (ref 3.5–5.1)
Sodium: 135 mmol/L (ref 135–145)
TOTAL PROTEIN: 6.4 g/dL — AB (ref 6.5–8.1)

## 2018-04-06 LAB — GLUCOSE, CAPILLARY
GLUCOSE-CAPILLARY: 113 mg/dL — AB (ref 70–99)
GLUCOSE-CAPILLARY: 176 mg/dL — AB (ref 70–99)
Glucose-Capillary: 127 mg/dL — ABNORMAL HIGH (ref 70–99)
Glucose-Capillary: 107 mg/dL — ABNORMAL HIGH (ref 70–99)
Glucose-Capillary: 104 mg/dL — ABNORMAL HIGH (ref 70–99)

## 2018-04-06 LAB — PHOSPHORUS: PHOSPHORUS: 2.9 mg/dL (ref 2.5–4.6)

## 2018-04-06 LAB — MAGNESIUM: MAGNESIUM: 1.7 mg/dL (ref 1.7–2.4)

## 2018-04-06 MED ORDER — TRACE MINERALS CR-CU-MN-SE-ZN 10-1000-500-60 MCG/ML IV SOLN
INTRAVENOUS | Status: AC
  Administered 2018-04-06: 17:00:00 via INTRAVENOUS
  Filled 2018-04-06: qty 1440

## 2018-04-06 MED ORDER — INSULIN ASPART 100 UNIT/ML ~~LOC~~ SOLN
0.0000 [IU] | Freq: Four times a day (QID) | SUBCUTANEOUS | Status: DC
  Administered 2018-04-07 – 2018-04-11 (×5): 1 [IU] via SUBCUTANEOUS
  Filled 2018-04-06 (×5): qty 1

## 2018-04-06 MED ORDER — FAT EMULSION PLANT BASED 20 % IV EMUL
250.0000 mL | INTRAVENOUS | Status: AC
  Administered 2018-04-06: 250 mL via INTRAVENOUS
  Filled 2018-04-06: qty 250

## 2018-04-06 NOTE — Progress Notes (Signed)
SURGICAL PROGRESS NOTE   Hospital Day(s): 11.   Post op day(s): 1 Day Post-Op.   Interval History: Patient seen and examined, no acute events or new complaints overnight. Patient reports feeling ok with tolerable pain. Denies nausea or vomiting.  Vital signs in last 24 hours: [min-max] current  Temp:  [97.7 F (36.5 C)-99.1 F (37.3 C)] 99.1 F (37.3 C) (09/10 1242) Pulse Rate:  [78-95] 92 (09/10 1242) Resp:  [13-27] 16 (09/10 1242) BP: (105-127)/(66-81) 118/81 (09/10 1242) SpO2:  [98 %-100 %] 100 % (09/10 1242)     Height: 5\' 4"  (162.6 cm) Weight: 48.4 kg BMI (Calculated): 18.31   Physical Exam:  Constitutional: alert, cooperative and no distress  Respiratory: breathing non-labored at rest  Cardiovascular: regular rate and sinus rhythm  Gastrointestinal: soft, non-tender, and non-distended  Labs:  CBC Latest Ref Rng & Units 04/06/2018 04/05/2018 04/03/2018  WBC 3.6 - 11.0 K/uL 12.4(H) 3.7 3.6  Hemoglobin 12.0 - 16.0 g/dL 8.2(L) 8.3(L) 8.2(L)  Hematocrit 35.0 - 47.0 % 25.5(L) 25.2(L) 24.9(L)  Platelets 150 - 440 K/uL 201 200 192   CMP Latest Ref Rng & Units 04/06/2018 04/05/2018 04/03/2018  Glucose 70 - 99 mg/dL 06/03/2018) 98 88  BUN 8 - 23 mg/dL 13 13 11   Creatinine 0.44 - 1.00 mg/dL 124(P 8.09  Sodium 135 - 145 mmol/L 135 136 138  Potassium 3.5 - 5.1 mmol/L 3.8 3.2(L) 3.5  Chloride 98 - 111 mmol/L 107 103 105  CO2 22 - 32 mmol/L 23 26 28   Calcium 8.9 - 10.3 mg/dL 8.1(L) 8.3(L) 8.4(L)  Total Protein 6.5 - 8.1 g/dL 6.4(L) 6.9 6.7  Total Bilirubin 0.3 - 1.2 mg/dL 0.4 0.3 9.83)  Alkaline Phos 38 - 126 U/L 75 91 71  AST 15 - 41 U/L 44(H) 71(H) 78(H)  ALT 0 - 44 U/L 67(H) 93(H) 78(H)    Imaging studies: No new pertinent imaging studies   Assessment/Plan:  62 y.o. female with sigmoid stricture 1 Day Post-Op s/p laparoscopic hand assisted sigmoidectomy and splenic flexure mobilization, complicated by pertinent comorbidities including chronic anemia, improved  malnutrition.  Patient underwent sigmoid colectomy with anastomosis yesterday and is tolerating well. No protective loop ileostomy was done since patient had significant improvement of nutrition and the proximal colon was not distended and significantly clean of stool with the slow bowel prep that she had for a week.   Today the patient is recovering well. No significant lab abnormality. Pain is controlled. Adequate urine output. Will remove foley today. Will continue with clear liquid diet and TPN until bowel function return and patient tolerates more solid diet. Patient was encourage to get out of bed and perform incentive spirometer.   Daughter called yesterday night after surgery and discussed all the intra operative finding and the plan of care.   , MD

## 2018-04-06 NOTE — Progress Notes (Signed)
Verbal order from MD to removed foley. Foley was removed at 0930 pt due to void by 1730 04/06/2018.   Morgan Reid [employer]

## 2018-04-06 NOTE — Consult Note (Signed)
PHARMACY - ADULT TOTAL PARENTERAL NUTRITION CONSULT NOTE   Pharmacy Consult for TPN Initiation and Management  Indication: Severe malnutrition  Patient Measurements: Height: 5\' 4"  (162.6 cm) Weight: 106 lb 11.2 oz (48.4 kg) IBW/kg (Calculated) : 54.7 TPN AdjBW (KG): 48.4 Body mass index is 18.32 kg/m.  Assessment: 62 yo female s/p sigmoidoscopy, which found signficant narrowing of sigmoid colon. Pharmacy was consulted to initiate TPN on POD 1 in 63 yo female with severe malnutrition in need of bowel surgery with anastomosis. Patient has had significant weight loss (20-30lbs over a few months) due to sigmoid mass, abdominal pain and possible partial obstruction due to the sigmoid stricture. POD 1  GI: GI recommends iron studies due to history of microcytic anemia, says inflammatory bowel disease panel is pending (which will not change course - a stricture biopsy/surgical specimen would me more indicative of crohn's disease). Endo: No Hx of DM, BG 107-127 Insulin requirements in the past 24 hours: 0 unit - none ordered Lytes:  Sodium (mmol/L)  Date Value  04/06/2018 135   Potassium (mmol/L)  Date Value  04/06/2018 3.8   Magnesium (mg/dL)  Date Value  06/06/2018 1.7   Phosphorus (mg/dL)  Date Value  65/78/4696 2.9   Calcium (mg/dL)  Date Value  29/52/8413 8.1 (L)   Albumin (g/dL)  Date Value  24/40/1027 2.3 (L)    Renal: Scr: 0.57 mg/dL, CrCl: 25/36/6440  mL/min  TPN Access: 8/31 @ 1900 TPN start date: 8/31 Nutritional Goals (per RD recommendation):  KCal: 1382 Protein: 72 g Fluid: 1620 mL  Goal TPN rate is: 60 mL/hr    Current Nutrition: TPN (at goal), advancing diet   Plan:  Continue TPN at goal rate Clinimix E 5/15 at 60 mL/hr with ILE 20% 15 ml/hr x 12 hours.   Add MVI, trace elements to TPN daily. Pt has completed thiamine x 3 days.   K low today at 3.2 - will order KCl 10 mEq IV x3 runs  SSI resumed with some elevated CBGs.   Will check labs on 9/11 and  continue to monitor electrolytes  Pharmacy will continue to follow.  11/11, PharmD Clinical Pharmacist  04/06/2018 2:46 PM

## 2018-04-06 NOTE — Progress Notes (Signed)
Patient has gratitude for the successful surgical outcome and attributes this to her faith and medical team. Patient continues to process how she will accommodate future changes that will be needed in her life.  She is mostly positive and has a clear vision for recovery. Chaplain supported her and provided emotional support. Chaplain concluded the encounter with prayer, which was well-received.

## 2018-04-06 NOTE — Progress Notes (Addendum)
Nutrition Follow Up Note   DOCUMENTATION CODES:   Severe malnutrition in context of acute illness/injury  INTERVENTION:   Continue Clinimix 5/15 with electrolytes at goal rate of 60 ml/hr until patient is able to meet 60% of her estimated needs via oral intake  Continue 20% lipids _0 /hr x 12 hrs/day    Regimen provides 1382kcal/day, 72g/day protein, 1648m volume    Continue MVI daily   Continue trace elements daily    Daily weights   NUTRITION DIAGNOSIS:   Severe Malnutrition related to acute illness as evidenced by 22% percent weight loss in <5 months, mild to moderate fat depletions, severe muscle depletions.  GOAL:   Patient will meet greater than or equal to 90% of their needs  -met with TPN  MONITOR:   Diet advancement, Labs, Weight trends, Skin, I & O's, Other (Comment)(TPN)  ASSESSMENT:   62y.o. female with a known history of collagen vascular disease, rheumatoid arthritis, chronic anemia who presents with sigmoid stricture of unknown etiology   Pt tolerating TPN well; recommend continue at goal rate until patient able to meet 60% of her estimated needs via oral intake. Pt s/p laparoscopic sigmoidectomy with laparoscopic mobilization of splenic flexure and primary anastomosis (coloproctostomy) 9/9; pt with significant thickening and chronic inflammation of the sigmoid colon severely adhere to the left lower abdominal wall per MD note. Per chart, pt with ~6lb weight gain since admit.   Medications reviewed and include: zofran, vancomycin, morphine, oxycodone   Labs reviewed: K 3.8 wnl, Ca 8.1(L) adj. 9.46 wnl, P 2.9 wnl, Mg 1.7 wnl, alb 2.3(L) AST- 44(H), ALT 67(H) Prealbumin- 20.2- 9/9 Triglycerides- 66- 9/9 Wbc- 12.4(H), Hgb 8.2(L), Hct 25.5(L) cbgs- 84, 94, 191, 176, 127 x 24 hrs  Diet Order:   Diet Order            Diet clear liquid Room service appropriate? Yes; Fluid consistency: Thin  Diet effective now             EDUCATION NEEDS:    Education needs have been addressed  Skin:  Skin Assessment: Reviewed RN Assessment(ecchymosis )  Last BM:  9/9  Height:   Ht Readings from Last 1 Encounters:  04/05/18 _1  (1.626 m)    Weight:   Wt Readings from Last 1 Encounters:  04/05/18 48.4 kg    Ideal Body Weight:  54.5 kg  BMI:  Body mass index is 18.32 kg/m.  Estimated Nutritional Needs:   Kcal:  1300-1500kcal/day   Protein:  70-78g/day   Fluid:  >1.3L/day   CKoleen DistanceMS, RD, LDN Pager #- [phone]#- [phone] Hours Pager: [phone]

## 2018-04-07 LAB — GLUCOSE, CAPILLARY
GLUCOSE-CAPILLARY: 107 mg/dL — AB (ref 70–99)
GLUCOSE-CAPILLARY: 125 mg/dL — AB (ref 70–99)
GLUCOSE-CAPILLARY: 119 mg/dL — AB (ref 70–99)
Glucose-Capillary: 113 mg/dL — ABNORMAL HIGH (ref 70–99)

## 2018-04-07 LAB — BASIC METABOLIC PANEL
Anion gap: 5 (ref 5–15)
BUN: 10 mg/dL (ref 8–23)
CO2: 28 mmol/L (ref 22–32)
CREATININE: 0.4 mg/dL — AB (ref 0.44–1.00)
Calcium: 8.5 mg/dL — ABNORMAL LOW (ref 8.9–10.3)
Chloride: 103 mmol/L (ref 98–111)
GFR calc Af Amer: >60 mL/min (ref >60)
GFR calc non Af Amer: >60 mL/min (ref >60)
Glucose, Bld: 120 mg/dL — ABNORMAL HIGH (ref 70–99)
Potassium: 3.7 mmol/L (ref 3.5–5.1)
Sodium: 136 mmol/L (ref 135–145)

## 2018-04-07 LAB — SURGICAL PATHOLOGY

## 2018-04-07 LAB — PHOSPHORUS: Phosphorus: 3.5 mg/dL (ref 2.5–4.6)

## 2018-04-07 LAB — VANCOMYCIN, TROUGH: Vancomycin Tr: <4 ug/mL — ABNORMAL LOW (ref 15–20)

## 2018-04-07 LAB — MAGNESIUM: MAGNESIUM: 1.8 mg/dL (ref 1.7–2.4)

## 2018-04-07 MED ORDER — VANCOMYCIN HCL IN DEXTROSE 750-5 MG/150ML-% IV SOLN
750.0000 mg | Freq: Two times a day (BID) | INTRAVENOUS | Status: DC
  Administered 2018-04-07 – 2018-04-08 (×2): 750 mg via INTRAVENOUS
  Filled 2018-04-07 (×4): qty 150

## 2018-04-07 MED ORDER — TRACE MINERALS CR-CU-MN-SE-ZN 10-1000-500-60 MCG/ML IV SOLN
INTRAVENOUS | Status: AC
  Administered 2018-04-07: 18:00:00 via INTRAVENOUS
  Filled 2018-04-07: qty 1440

## 2018-04-07 MED ORDER — FAT EMULSION PLANT BASED 20 % IV EMUL
250.0000 mL | INTRAVENOUS | Status: AC
  Administered 2018-04-07: 250 mL via INTRAVENOUS
  Filled 2018-04-07: qty 250

## 2018-04-07 NOTE — Consult Note (Signed)
PHARMACY - ADULT TOTAL PARENTERAL NUTRITION CONSULT NOTE   Pharmacy Consult for TPN Initiation and Management  Indication: Severe malnutrition  Patient Measurements: Height: 5\' 4"  (162.6 cm) Weight: 112 lb 14 oz (51.2 kg) IBW/kg (Calculated) : 54.7 TPN AdjBW (KG): 48.4 Body mass index is 19.38 kg/m.  Assessment: 62 yo female s/p sigmoidoscopy, which found signficant narrowing of sigmoid colon. Pharmacy was consulted to initiate TPN on POD 1 in 62 yo female with severe malnutrition in need of bowel surgery with anastomosis. Patient has had significant weight loss (20-30lbs over a few months) due to sigmoid mass, abdominal pain and possible partial obstruction due to the sigmoid stricture. POD 1  GI: GI recommends iron studies due to history of microcytic anemia, says inflammatory bowel disease panel is pending (which will not change course - a stricture biopsy/surgical specimen would me more indicative of crohn's disease). Endo: No Hx of DM, BG 104-127 Insulin requirements in the past 24 hours: 0 unit   Lytes:  Sodium (mmol/L)  Date Value  04/07/2018 136   Potassium (mmol/L)  Date Value  04/07/2018 3.7   Magnesium (mg/dL)  Date Value  06/07/2018 1.8   Phosphorus (mg/dL)  Date Value  16/04/9603 3.5   Calcium (mg/dL)  Date Value  54/03/8118 8.5 (L)   Albumin (g/dL)  Date Value  14/78/2956 2.3 (L)    Renal: Scr: 0.4 mg/dL  TPN Access: 21/30/8657 @ 8/46 TPN start date: 8/31 Nutritional Goals (per RD recommendation):  KCal: 1382 Protein: 72 g Fluid: 1620 mL  Goal TPN rate is: 60 mL/hr    Current Nutrition: TPN (at goal), advancing diet   Plan:  Continue TPN at goal rate Clinimix E 5/15 at 60 mL/hr with ILE 20% 15 ml/hr x 12 hours.   Add MVI, trace elements to TPN daily. Pt has completed thiamine x 3 days.   Electrolytes WNL.   SSI resumed with some elevated CBGs.   Will check labs on 9/11 and continue to monitor electrolytes  Pharmacy will continue to  follow.  11/11, PharmD Clinical Pharmacist  04/07/2018 2:56 PM

## 2018-04-07 NOTE — Progress Notes (Signed)
SURGICAL PROGRESS NOTE   Hospital Day(s): 12.   Post op day(s): 2 Days Post-Op.   Interval History: Patient seen and examined, no acute events or new complaints overnight. Patient reports good. Denies worsening abdominal pain. Denies fever or chills. Denies nausea or vomiting.   Vital signs in last 24 hours: [min-max] current  Temp:  [98.5 F (36.9 C)-98.9 F (37.2 C)] 98.9 F (37.2 C) (09/11 1301) Pulse Rate:  [88-97] 88 (09/11 1301) Resp:  [16-24] 22 (09/11 1301) BP: (111-128)/(68-77) 111/68 (09/11 1301) SpO2:  [100 %] 100 % (09/11 1301) Weight:  [51.2 kg] 51.2 kg (09/11 0517)     Height: 5\' 4"  (162.6 cm) Weight: 51.2 kg BMI (Calculated): 19.37   Physical Exam:  Constitutional: alert, cooperative and no distress  Respiratory: breathing non-labored at rest  Cardiovascular: regular rate and sinus rhythm  Gastrointestinal: soft, mild-tender, and non-distended. Wounds are dry and clean.   Labs:  CBC Latest Ref Rng & Units 04/06/2018 04/05/2018 04/03/2018  WBC 3.6 - 11.0 K/uL 12.4(H) 3.7 3.6  Hemoglobin 12.0 - 16.0 g/dL 8.2(L) 8.3(L) 8.2(L)  Hematocrit 35.0 - 47.0 % 25.5(L) 25.2(L) 24.9(L)  Platelets 150 - 440 K/uL 201 200 192   CMP Latest Ref Rng & Units 04/07/2018 04/06/2018 04/05/2018  Glucose 70 - 99 mg/dL 06/05/2018) 712(W) 98  BUN 8 - 23 mg/dL 10 13 13   Creatinine 0.44 - 1.00 mg/dL 580(D) 9.83(J  Sodium 135 - 145 mmol/L 136 135 136  Potassium 3.5 - 5.1 mmol/L 3.7 3.8 3.2(L)  Chloride 98 - 111 mmol/L 103 107 103  CO2 22 - 32 mmol/L 28 23 26   Calcium 8.9 - 10.3 mg/dL 8.25) 8.1(L) 8.3(L)  Total Protein 6.5 - 8.1 g/dL - 6.4(L) 6.9  Total Bilirubin 0.3 - 1.2 mg/dL - 0.4 0.3  Alkaline Phos 38 - 126 U/L - 75 91  AST 15 - 41 U/L - 44(H) 71(H)  ALT 0 - 44 U/L - 67(H) 93(H)    Imaging studies: No new pertinent imaging studies   Assessment/Plan:  62 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 Patient today stable, afebrile, no tachycardia, normal blood pressure. Controlled abdominal pain. Ambulating to the bathroom and voiding spontaneously. Still not passing gas. Will not advance diet until bowel function returns. Will continue TPN.   , MD

## 2018-04-07 NOTE — Progress Notes (Signed)
Pharmacy Antibiotic Note  Morgan Reid is a 62 y.o. female admitted on 03/25/2018 with cellulitis.  Pharmacy has been consulted for vancomycin dosing.  Plan: Will increase vancomycin dosing to 750 mg iv q 12 hours with trough below goal although not at steady-state. Will f/u plans for abx and order levels/adjust dosing as indicated to target a trough of 10-15 mcg/ml.   Height: 5\' 4"  (162.6 cm) Weight: 112 lb 14 oz (51.2 kg) IBW/kg (Calculated) : 54.7  Temp (24hrs), Avg:98.8 F (37.1 C), Min:98.5 F (36.9 C), Max:98.9 F (37.2 C)  Recent Labs  Lab 04/02/18 0536 04/03/18 0549 04/05/18 0600 04/06/18 0409 04/07/18 0536 04/07/18 0649  WBC  --  3.6 3.7 12.4*  --   --   CREATININE 0.37* 0.61 0.51 0.57 0.40*  --   VANCOTROUGH  --   --   --   --   --  <4*    Estimated Creatinine Clearance: 58.9 mL/min (A) (by C-G formula based on SCr of 0.4 mg/dL (L)).    No Known Allergies  Antimicrobials this admission: vancomycin 9/8 >>   No active cultures at this time  Thank you for allowing pharmacy to be a part of this patient's care.  11/8, PharmD 04/07/2018 3:05 PM

## 2018-04-08 LAB — COMPREHENSIVE METABOLIC PANEL
ALT: 35 U/L (ref 0–44)
ANION GAP: 4 — AB (ref 5–15)
AST: 18 U/L (ref 15–41)
Albumin: 2.4 g/dL — ABNORMAL LOW (ref 3.5–5.0)
Alkaline Phosphatase: 63 U/L (ref 38–126)
BUN: 9 mg/dL (ref 8–23)
CHLORIDE: 99 mmol/L (ref 98–111)
CO2: 30 mmol/L (ref 22–32)
Calcium: 8.5 mg/dL — ABNORMAL LOW (ref 8.9–10.3)
Creatinine, Ser: 0.42 mg/dL — ABNORMAL LOW (ref 0.44–1.00)
GFR calc Af Amer: >60 mL/min (ref >60)
GFR calc non Af Amer: >60 mL/min (ref >60)
GLUCOSE: 116 mg/dL — AB (ref 70–99)
POTASSIUM: 3.5 mmol/L (ref 3.5–5.1)
SODIUM: 133 mmol/L — AB (ref 135–145)
Total Bilirubin: 0.2 mg/dL — ABNORMAL LOW (ref 0.3–1.2)
Total Protein: 6.4 g/dL — ABNORMAL LOW (ref 6.5–8.1)

## 2018-04-08 LAB — GLUCOSE, CAPILLARY
GLUCOSE-CAPILLARY: 119 mg/dL — AB (ref 70–99)
GLUCOSE-CAPILLARY: 126 mg/dL — AB (ref 70–99)
Glucose-Capillary: 113 mg/dL — ABNORMAL HIGH (ref 70–99)
Glucose-Capillary: 124 mg/dL — ABNORMAL HIGH (ref 70–99)

## 2018-04-08 LAB — MAGNESIUM: Magnesium: 1.8 mg/dL (ref 1.7–2.4)

## 2018-04-08 LAB — PHOSPHORUS: Phosphorus: 4.5 mg/dL (ref 2.5–4.6)

## 2018-04-08 MED ORDER — TRACE MINERALS CR-CU-MN-SE-ZN 10-1000-500-60 MCG/ML IV SOLN
INTRAVENOUS | Status: AC
  Administered 2018-04-08: 18:00:00 via INTRAVENOUS
  Filled 2018-04-08: qty 1440

## 2018-04-08 MED ORDER — FAT EMULSION PLANT BASED 20 % IV EMUL
250.0000 mL | INTRAVENOUS | Status: AC
  Administered 2018-04-08: 250 mL via INTRAVENOUS
  Filled 2018-04-08: qty 250

## 2018-04-08 NOTE — Care Management (Signed)
Discussed LTACH with MD.  MD does not want to pursue at this time

## 2018-04-08 NOTE — Progress Notes (Signed)
SURGICAL PROGRESS NOTE   Hospital Day(s): 13.   Post op day(s): 3 Days Post-Op.   Interval History: Patient seen and examined, no acute events or new complaints overnight. Patient reports feeling good, denies nausea, vomiting fever or chills. Not sure if passed gass.  Vital signs in last 24 hours: [min-max] current  Temp:  [98.5 F (36.9 C)-99.3 F (37.4 C)] 98.5 F (36.9 C) (09/12 1336) Pulse Rate:  [92-105] 94 (09/12 1336) Resp:  [18] 18 (09/12 1336) BP: (102-112)/(70-74) 112/74 (09/12 1336) SpO2:  [100 %] 100 % (09/12 1336)     Height: 5\' 4"  (162.6 cm) Weight: 51.2 kg BMI (Calculated): 19.37   Physical Exam:  Constitutional: alert, cooperative and no distress  Respiratory: breathing non-labored at rest  Cardiovascular: regular rate and sinus rhythm  Gastrointestinal: soft, non-tender, and non-distended. Wounds are dry and clean.   Labs:  CBC Latest Ref Rng & Units 04/06/2018 04/05/2018 04/03/2018  WBC 3.6 - 11.0 K/uL 12.4(H) 3.7 3.6  Hemoglobin 12.0 - 16.0 g/dL 8.2(L) 8.3(L) 8.2(L)  Hematocrit 35.0 - 47.0 % 25.5(L) 25.2(L) 24.9(L)  Platelets 150 - 440 K/uL 201 200 192   CMP Latest Ref Rng & Units 04/08/2018 04/07/2018 04/06/2018  Glucose 70 - 99 mg/dL 06/06/2018) 176(H) 607(P)  BUN 8 - 23 mg/dL 9 10 13   Creatinine 0.44 - 1.00 mg/dL 710(G) ) 2.69(S  Sodium 135 - 145 mmol/L 133(L) 136 135  Potassium 3.5 - 5.1 mmol/L 3.5 3.7 3.8  Chloride 98 - 111 mmol/L 99 103 107  CO2 22 - 32 mmol/L 30 28 23   Calcium 8.9 - 10.3 mg/dL 8.54(O) 2.70) 8.1(L)  Total Protein 6.5 - 8.1 g/dL 6.4(L) - 6.4(L)  Total Bilirubin 0.3 - 1.2 mg/dL ) - 0.4  Alkaline Phos 38 - 126 U/L 63 - 75  AST 15 - 41 U/L 18 - 44(H)  ALT 0 - 44 U/L 35 - 67(H)    Imaging studies: No new pertinent imaging studies   Assessment/Plan:  62 y.o.femalewith sigmoid stricture3 Day Post-Ops/p laparoscopic hand assisted sigmoidectomy and splenic flexure mobilization, complicated by pertinent comorbidities  includingchronic anemia, improved malnutrition. Patient continue afebrile with normal heart rate. Abdominal pain controlled. Still not passing gas. Will keep in clear liquid diet and TPN. Encourage to ambulate.    0.9(F, MD

## 2018-04-08 NOTE — Consult Note (Signed)
PHARMACY - ADULT TOTAL PARENTERAL NUTRITION CONSULT NOTE   Pharmacy Consult for TPN Initiation and Management  Indication: Severe malnutrition  Patient Measurements: Height: 5\' 4"  (162.6 cm) Weight: 112 lb 14 oz (51.2 kg) IBW/kg (Calculated) : 54.7 TPN AdjBW (KG): 48.4 Body mass index is 19.38 kg/m.  Assessment: 62 yo female s/p sigmoidoscopy, which found signficant narrowing of sigmoid colon. Pharmacy was consulted to initiate TPN on POD 1 in 62 yo female with severe malnutrition in need of bowel surgery with anastomosis. Patient has had significant weight loss (20-30lbs over a few months) due to sigmoid mass, abdominal pain and possible partial obstruction due to the sigmoid stricture. POD 3  GI: GI recommends iron studies due to history of microcytic anemia, says inflammatory bowel disease panel is pending (which will not change course - a stricture biopsy/surgical specimen would me more indicative of crohn's disease). Endo: No Hx of DM, BG 107-125 Insulin requirements in the past 24 hours:  1 unit   Lytes:  Sodium (mmol/L)  Date Value  04/08/2018 133 (L)   Potassium (mmol/L)  Date Value  04/08/2018 3.5   Magnesium (mg/dL)  Date Value  06/08/2018 1.8   Phosphorus (mg/dL)  Date Value  96/78/9381 4.5   Calcium (mg/dL)  Date Value  01/75/1025 8.5 (L)   Albumin (g/dL)  Date Value  85/27/7824 2.4 (L)    Renal: Scr: 0.42 mg/dL  TPN Access: 23/53/6144 @ 3/15 TPN start date: 8/31 Nutritional Goals (per RD recommendation):  KCal: 1382 Protein: 72 g Fluid: 1620 mL  Goal TPN rate is: 60 mL/hr    Current Nutrition: TPN (at goal), advancing diet   Plan:  Continue TPN at goal rate Clinimix E 5/15 at 60 mL/hr with ILE 20% 15 ml/hr x 12 hours.   Add MVI, trace elements to TPN daily. Pt has completed thiamine x 3 days.   Electrolytes WNL for several days. Next labs 9/14.  SSI resumed.  Pharmacy will continue to follow.  10/14, PharmD Clinical Pharmacist   04/08/2018 11:27 AM

## 2018-04-08 NOTE — Plan of Care (Signed)
Waiting for bowel function to return. No flatulence has been passed. The patient has ambulated in the hallway with the nurse. The patient complained of pain while walking yet incision continuous to look intact. Pain medication provided. The patient still continuous to have no appetite. The only had 67ml of coffee and is currently working on drinking a apple juice cup. TPN still running. IV vancomycin given.  Problem: Education: Goal: Knowledge of General Education information will improve Description Including pain rating scale, medication(s)/side effects and non-pharmacologic comfort measures Outcome: Progressing   Problem: Health Behavior/Discharge Planning: Goal: Ability to manage health-related needs will improve Outcome: Progressing   Problem: Clinical Measurements: Goal: Ability to maintain clinical measurements within normal limits will improve Outcome: Progressing Goal: Will remain free from infection Outcome: Progressing Goal: Diagnostic test results will improve Outcome: Progressing Goal: Respiratory complications will improve Outcome: Progressing Goal: Cardiovascular complication will be avoided Outcome: Progressing   Problem: Activity: Goal: Risk for activity intolerance will decrease Outcome: Progressing   Problem: Nutrition: Goal: Adequate nutrition will be maintained Outcome: Progressing   Problem: Coping: Goal: Level of anxiety will decrease Outcome: Progressing   Problem: Elimination: Goal: Will not experience complications related to bowel motility Outcome: Progressing Goal: Will not experience complications related to urinary retention Outcome: Progressing   Problem: Pain Managment: Goal: General experience of comfort will improve Outcome: Progressing   Problem: Safety: Goal: Ability to remain free from injury will improve Outcome: Progressing   Problem: Skin Integrity: Goal: Risk for impaired skin integrity will decrease Outcome: Progressing

## 2018-04-09 LAB — BASIC METABOLIC PANEL
Anion gap: 8 (ref 5–15)
BUN: 12 mg/dL (ref 8–23)
CHLORIDE: 98 mmol/L (ref 98–111)
CO2: 29 mmol/L (ref 22–32)
CREATININE: 0.56 mg/dL (ref 0.44–1.00)
Calcium: 8.9 mg/dL (ref 8.9–10.3)
GFR calc Af Amer: >60 mL/min (ref >60)
GFR calc non Af Amer: >60 mL/min (ref >60)
Glucose, Bld: 114 mg/dL — ABNORMAL HIGH (ref 70–99)
POTASSIUM: 3.7 mmol/L (ref 3.5–5.1)
Sodium: 135 mmol/L (ref 135–145)

## 2018-04-09 LAB — GLUCOSE, CAPILLARY
GLUCOSE-CAPILLARY: 115 mg/dL — AB (ref 70–99)
Glucose-Capillary: 135 mg/dL — ABNORMAL HIGH (ref 70–99)
Glucose-Capillary: 111 mg/dL — ABNORMAL HIGH (ref 70–99)
Glucose-Capillary: 114 mg/dL — ABNORMAL HIGH (ref 70–99)

## 2018-04-09 LAB — CBC WITH DIFFERENTIAL/PLATELET
BASOS PCT: 0 %
Band Neutrophils: 0 %
Basophils Absolute: 0 10*3/uL (ref 0–0.1)
Blasts: 0 %
EOS PCT: 0 %
Eosinophils Absolute: 0 10*3/uL (ref 0–0.7)
HCT: 29.3 % — ABNORMAL LOW (ref 35.0–47.0)
HEMOGLOBIN: 8.1 g/dL — AB (ref 12.0–16.0)
LYMPHS PCT: 15 %
Lymphs Abs: 0.8 10*3/uL — ABNORMAL LOW (ref 1.0–3.6)
MCH: 24.7 pg — AB (ref 26.0–34.0)
MCHC: 27.5 g/dL — AB (ref 32.0–36.0)
MCV: 89.5 fL (ref 80.0–100.0)
MYELOCYTES: 0 %
Metamyelocytes Relative: 1 %
Monocytes Absolute: 0.2 10*3/uL (ref 0.2–0.9)
Monocytes Relative: 3 %
NEUTROS PCT: 81 %
NRBC: 0 /100{WBCs}
Neutro Abs: 4.2 10*3/uL (ref 1.4–6.5)
OTHER: 0 %
PROMYELOCYTES RELATIVE: 0 %
Platelets: 235 10*3/uL (ref 150–440)
RBC: 3.27 MIL/uL — AB (ref 3.80–5.20)
RDW: 19.1 % — ABNORMAL HIGH (ref 11.5–14.5)
WBC: 5.2 10*3/uL (ref 3.6–11.0)

## 2018-04-09 MED ORDER — HYDROMORPHONE HCL 1 MG/ML IJ SOLN
0.5000 mg | INTRAMUSCULAR | Status: DC | PRN
  Administered 2018-04-09 – 2018-04-13 (×10): 0.5 mg via INTRAVENOUS
  Filled 2018-04-09 (×9): qty 0.5

## 2018-04-09 MED ORDER — FAT EMULSION PLANT BASED 20 % IV EMUL
250.0000 mL | INTRAVENOUS | Status: AC
  Administered 2018-04-09: 250 mL via INTRAVENOUS
  Filled 2018-04-09: qty 250

## 2018-04-09 MED ORDER — TRACE MINERALS CR-CU-MN-SE-ZN 10-1000-500-60 MCG/ML IV SOLN
INTRAVENOUS | Status: AC
  Administered 2018-04-09: 19:00:00 via INTRAVENOUS
  Filled 2018-04-09: qty 1440

## 2018-04-09 NOTE — Progress Notes (Signed)
SURGERY FOLLOW UP NOTE  Patient re evaluated and refers wants to go home. Patient has not pas gas but refers is feeling the urge to pass it. New labs show no leukocytosis and no significant electrolytes imbalance. Will discontinue Morphine as patient does not likes how she feels. Pain controlled with Oxycodone. Ambulating. Wound dry and clean. Will continue with TPN and clear liquids until passing gas.

## 2018-04-09 NOTE — Consult Note (Signed)
PHARMACY - ADULT TOTAL PARENTERAL NUTRITION CONSULT NOTE   Pharmacy Consult for TPN Initiation and Management  Indication: Severe malnutrition  Patient Measurements: Height: 5\' 4"  (162.6 cm) Weight: 109 lb 5.6 oz (49.6 kg) IBW/kg (Calculated) : 54.7 TPN AdjBW (KG): 48.4 Body mass index is 18.77 kg/m.  Assessment: 62 yo female s/p sigmoidoscopy, which found signficant narrowing of sigmoid colon. Pharmacy was consulted to initiate TPN on POD 1 in 62 yo female with severe malnutrition in need of bowel surgery with anastomosis. Patient has had significant weight loss (20-30lbs over a few months) due to sigmoid mass, abdominal pain and possible partial obstruction due to the sigmoid stricture. POD 3  GI: GI recommends iron studies due to history of microcytic anemia, says inflammatory bowel disease panel is pending (which will not change course - a stricture biopsy/surgical specimen would me more indicative of crohn's disease). Endo: No Hx of DM, BG 115-135 Insulin requirements in the past 24 hours: 2 unit   Lytes:  Sodium (mmol/L)  Date Value  04/09/2018 135   Potassium (mmol/L)  Date Value  04/09/2018 3.7   Magnesium (mg/dL)  Date Value  04/11/2018 1.8   Phosphorus (mg/dL)  Date Value  16/04/9603 4.5   Calcium (mg/dL)  Date Value  54/03/8118 8.9   Albumin (g/dL)  Date Value  14/78/2956 2.4 (L)    Renal: Scr: 0.56 mg/dL  TPN Access: 21/30/8657 @ 8/46 TPN start date: 8/31 Nutritional Goals (per RD recommendation):  KCal: 1382 Protein: 72 g Fluid: 1620 mL  Goal TPN rate is: 60 mL/hr    Current Nutrition: TPN (at goal), advancing diet   Plan:  Continue TPN at goal rate Clinimix E 5/15 at 60 mL/hr with ILE 20% 15 ml/hr x 12 hours.   Add MVI, trace elements to TPN daily. Pt has completed thiamine x 3 days.   Electrolytes WNL for several days. Next labs 9/14.  SSI resumed.  Pharmacy will continue to follow.  10/14, PharmD Clinical Pharmacist  04/09/2018  2:42 PM

## 2018-04-09 NOTE — Progress Notes (Signed)
SURGICAL PROGRESS NOTE   Hospital Day(s): 14.   Post op day(s): 4 Days Post-Op.   Interval History: Patient seen and examined, no acute events or new complaints overnight. Patient reports has the feeling that wants to go to the bathroom but still not passing flatus. Denies nausea or vomiting.  Vital signs in last 24 hours: [min-max] current  Temp:  [98.3 F (36.8 C)-98.8 F (37.1 C)] 98.3 F (36.8 C) (09/13 0523) Pulse Rate:  [94-111] 111 (09/13 0523) Resp:  [18] 18 (09/13 0523) BP: (112-123)/(73-77) 114/73 (09/13 0523) SpO2:  [99 %-100 %] 99 % (09/13 0523) Weight:  [49.6 kg] 49.6 kg (09/13 0523)     Height: 5\' 4"  (162.6 cm) Weight: 49.6 kg BMI (Calculated): 18.76    Physical Exam:  Constitutional: alert, cooperative and no distress  Respiratory: breathing non-labored at rest  Cardiovascular: regular rate and sinus rhythm  Gastrointestinal: soft, non-tender, and non-distended. Wound dry and clean.   Labs:  CBC Latest Ref Rng & Units 04/06/2018 04/05/2018 04/03/2018  WBC 3.6 - 11.0 K/uL 12.4(H) 3.7 3.6  Hemoglobin 12.0 - 16.0 g/dL 8.2(L) 8.3(L) 8.2(L)  Hematocrit 35.0 - 47.0 % 25.5(L) 25.2(L) 24.9(L)  Platelets 150 - 440 K/uL 201 200 192   CMP Latest Ref Rng & Units 04/08/2018 04/07/2018 04/06/2018  Glucose 70 - 99 mg/dL 06/06/2018) 852(D) 782(U)  BUN 8 - 23 mg/dL 9 10 13   Creatinine 0.44 - 1.00 mg/dL 235(T) ) 6.14(E  Sodium 135 - 145 mmol/L 133(L) 136 135  Potassium 3.5 - 5.1 mmol/L 3.5 3.7 3.8  Chloride 98 - 111 mmol/L 99 103 107  CO2 22 - 32 mmol/L 30 28 23   Calcium 8.9 - 10.3 mg/dL 3.15(Q) 0.08) 8.1(L)  Total Protein 6.5 - 8.1 g/dL 6.4(L) - 6.4(L)  Total Bilirubin 0.3 - 1.2 mg/dL ) - 0.4  Alkaline Phos 38 - 126 U/L 63 - 75  AST 15 - 41 U/L 18 - 44(H)  ALT 0 - 44 U/L 35 - 67(H)    Imaging studies: No new pertinent imaging studies   Assessment/Plan:  62 y.o.femalewith sigmoid stricture4Day Post-Ops/p laparoscopic hand assisted sigmoidectomy and splenic  flexure mobilization, complicated by pertinent comorbidities includingchronic anemia, improved malnutrition. Today patient with tachycardia in between 90-110, no fever episodes. Abdomen soft and depressible, no peritoneal signs. There is positive bowel sounds. Will order labs to evaluate WBC trend and electrolytes. Will continue TPN and IV hydration and follow with serial physical exams.    1.9(J, MD

## 2018-04-09 NOTE — Progress Notes (Signed)
   04/09/18 1100  Clinical Encounter Type  Visited With Patient  Visit Type Follow-up;Spiritual support  Recommendations Follow-up as needed.  Spiritual Encounters  Spiritual Needs Emotional;Prayer;Ritual  Stress Factors  Patient Stress Factors Health changes   Patient seemed a bit down today. The length of stay, liquid diet, lack of physical comfort, and her desire to go home all contribute to her frustration and sadness. Chaplain listened Chaplain provided emotional support, prayer, and anointed the patient, which lifted her spirit. Chaplain will follow-up as needed.

## 2018-04-10 LAB — BASIC METABOLIC PANEL
Anion gap: 5 (ref 5–15)
BUN: 13 mg/dL (ref 8–23)
CALCIUM: 8.7 mg/dL — AB (ref 8.9–10.3)
CO2: 28 mmol/L (ref 22–32)
CREATININE: 0.45 mg/dL (ref 0.44–1.00)
Chloride: 100 mmol/L (ref 98–111)
GFR calc Af Amer: >60 mL/min (ref >60)
GFR calc non Af Amer: >60 mL/min (ref >60)
GLUCOSE: 102 mg/dL — AB (ref 70–99)
Potassium: 3.9 mmol/L (ref 3.5–5.1)
SODIUM: 133 mmol/L — AB (ref 135–145)

## 2018-04-10 LAB — GLUCOSE, CAPILLARY
GLUCOSE-CAPILLARY: 113 mg/dL — AB (ref 70–99)
GLUCOSE-CAPILLARY: 119 mg/dL — AB (ref 70–99)
Glucose-Capillary: 126 mg/dL — ABNORMAL HIGH (ref 70–99)
Glucose-Capillary: 120 mg/dL — ABNORMAL HIGH (ref 70–99)

## 2018-04-10 LAB — PHOSPHORUS: Phosphorus: 4.1 mg/dL (ref 2.5–4.6)

## 2018-04-10 LAB — MAGNESIUM: MAGNESIUM: 1.8 mg/dL (ref 1.7–2.4)

## 2018-04-10 MED ORDER — FAT EMULSION PLANT BASED 20 % IV EMUL
250.0000 mL | INTRAVENOUS | Status: AC
  Administered 2018-04-10: 250 mL via INTRAVENOUS
  Filled 2018-04-10: qty 250

## 2018-04-10 MED ORDER — TRACE MINERALS CR-CU-MN-SE-ZN 10-1000-500-60 MCG/ML IV SOLN
INTRAVENOUS | Status: AC
  Administered 2018-04-10: 18:00:00 via INTRAVENOUS
  Filled 2018-04-10: qty 1440

## 2018-04-10 MED ORDER — PREMIER PROTEIN SHAKE
2.0000 [oz_av] | Freq: Four times a day (QID) | ORAL | Status: DC
  Administered 2018-04-10: 2 [oz_av] via ORAL

## 2018-04-10 NOTE — Consult Note (Signed)
PHARMACY - ADULT TOTAL PARENTERAL NUTRITION CONSULT NOTE   Pharmacy Consult for TPN Initiation and Management  Indication: Severe malnutrition  Patient Measurements: Height: 5\' 4"  (162.6 cm) Weight: 103 lb 9.9 oz (47 kg) IBW/kg (Calculated) : 54.7 TPN AdjBW (KG): 48.4 Body mass index is 17.79 kg/m.  Assessment: 62 yo female s/p sigmoidoscopy, which found signficant narrowing of sigmoid colon. Pharmacy was consulted to initiate TPN on POD 1 in 62 yo female with severe malnutrition in need of bowel surgery with anastomosis. Patient has had significant weight loss (20-30lbs over a few months) due to sigmoid mass, abdominal pain and possible partial obstruction due to the sigmoid stricture. POD 3  GI: GI recommends iron studies due to history of microcytic anemia, says inflammatory bowel disease panel is pending (which will not change course - a stricture biopsy/surgical specimen would me more indicative of crohn's disease). Endo: No Hx of DM, BG 115-135 Insulin requirements in the past 24 hours: 2 unit   Lytes:  Sodium (mmol/L)  Date Value  04/10/2018 133 (L)   Potassium (mmol/L)  Date Value  04/10/2018 3.9   Magnesium (mg/dL)  Date Value  04/12/2018 1.8   Phosphorus (mg/dL)  Date Value  75/91/6384 4.1   Calcium (mg/dL)  Date Value  66/59/9357 8.7 (L)   Albumin (g/dL)  Date Value  01/77/9390 2.4 (L)    Renal: Scr: 0.56 mg/dL  TPN Access: 30/03/2329 @ 0/76 TPN start date: 8/31 Nutritional Goals (per RD recommendation):  KCal: 1382 Protein: 72 g Fluid: 1620 mL  Goal TPN rate is: 60 mL/hr    Current Nutrition: TPN (at goal), advancing diet   Plan:  Continue TPN at goal rate Clinimix E 5/15 at 60 mL/hr with ILE 20% 15 ml/hr x 12 hours.   Add MVI, trace elements to TPN daily. Pt has completed thiamine x 3 days.   Electrolytes WNL for several days.   SSI resumed.  Pharmacy will continue to follow.  6/15, PharmD  Clinical Pharmacist  04/10/2018  10:47 AM

## 2018-04-10 NOTE — Progress Notes (Signed)
SURGICAL PROGRESS NOTE   Hospital Day(s): 15.   Post op day(s): 5 Days Post-Op.   Interval History: Patient seen and examined, no acute events or new complaints overnight. Patient reports passed gas multiple times. Refers feeling better with more energy. Pain controlled, denies vomitng.   Vital signs in last 24 hours: [min-max] current  Temp:  [98.4 F (36.9 C)-99 F (37.2 C)] 98.7 F (37.1 C) (09/14 0450) Pulse Rate:  [99-106] 99 (09/14 0450) Resp:  [17-20] 20 (09/14 0450) BP: (105-112)/(68-77) 105/68 (09/14 0450) SpO2:  [100 %] 100 % (09/14 0450) Weight:  [47 kg] 47 kg (09/14 0450)     Height: 5\' 4"  (162.6 cm) Weight: 47 kg BMI (Calculated): 17.78   Physical Exam:  Constitutional: alert, cooperative and no distress  Respiratory: breathing non-labored at rest  Cardiovascular: regular rate and sinus rhythm  Gastrointestinal: soft, non-tender, and non-distended. Wounds dry and clean.   Labs:  CBC Latest Ref Rng & Units 04/09/2018 04/06/2018 04/05/2018  WBC 3.6 - 11.0 K/uL 5.2 12.4(H) 3.7  Hemoglobin 12.0 - 16.0 g/dL 8.1(L) 8.2(L) 8.3(L)  Hematocrit 35.0 - 47.0 % 29.3(L) 25.5(L) 25.2(L)  Platelets 150 - 440 K/uL 235 201 200   CMP Latest Ref Rng & Units 04/10/2018 04/09/2018 04/08/2018  Glucose 70 - 99 mg/dL 06/08/2018) 160(V) 371(G)  BUN 8 - 23 mg/dL 13 12 9   Creatinine 0.44 - 1.00 mg/dL 626(R 4.85)  Sodium 135 - 145 mmol/L 133(L) 135 133(L)  Potassium 3.5 - 5.1 mmol/L 3.9 3.7 3.5  Chloride 98 - 111 mmol/L 100 98 99  CO2 22 - 32 mmol/L 28 29 30   Calcium 8.9 - 10.3 mg/dL 4.62) 8.9 7.03(J)  Total Protein 6.5 - 8.1 g/dL - - 6.4(L)  Total Bilirubin 0.3 - 1.2 mg/dL - - )  Alkaline Phos 38 - 126 U/L - - 63  AST 15 - 41 U/L - - 18  ALT 0 - 44 U/L - - 35    Imaging studies: No new pertinent imaging studies   Assessment/Plan:  62 y.o.femalewith sigmoid stricture5Day Post-Ops/p laparoscopic hand assisted sigmoidectomy and splenic flexure mobilization, complicated by  pertinent comorbidities includingchronic anemia, improved malnutrition. Today patient passed gas multiple times, sign of return bowel function. HR between high 90's and 106 as it has been during admission. No fever. No abdominal distention. Post operative pain controlled with current oral medications.  Will progress diet to full liquid an assess for toleration. Patient has not eaten solid diet in a while so diet progression will be slow as tolerated. If she tolerates full liquid, may start to decrease TPN. Will re start protein supplements.  Patient ambulating better.  3.8(H, MD

## 2018-04-11 LAB — BASIC METABOLIC PANEL
Anion gap: 6 (ref 5–15)
BUN: 12 mg/dL (ref 8–23)
CHLORIDE: 101 mmol/L (ref 98–111)
CO2: 28 mmol/L (ref 22–32)
Calcium: 8.6 mg/dL — ABNORMAL LOW (ref 8.9–10.3)
Creatinine, Ser: 0.51 mg/dL (ref 0.44–1.00)
GFR calc Af Amer: >60 mL/min (ref >60)
GFR calc non Af Amer: >60 mL/min (ref >60)
GLUCOSE: 92 mg/dL (ref 70–99)
Potassium: 4.1 mmol/L (ref 3.5–5.1)
Sodium: 135 mmol/L (ref 135–145)

## 2018-04-11 LAB — GLUCOSE, CAPILLARY
Glucose-Capillary: 107 mg/dL — ABNORMAL HIGH (ref 70–99)
Glucose-Capillary: 110 mg/dL — ABNORMAL HIGH (ref 70–99)
Glucose-Capillary: 113 mg/dL — ABNORMAL HIGH (ref 70–99)
Glucose-Capillary: 125 mg/dL — ABNORMAL HIGH (ref 70–99)

## 2018-04-11 LAB — MAGNESIUM: Magnesium: 1.9 mg/dL (ref 1.7–2.4)

## 2018-04-11 MED ORDER — TRACE MINERALS CR-CU-MN-SE-ZN 10-1000-500-60 MCG/ML IV SOLN
INTRAVENOUS | Status: AC
  Administered 2018-04-11: 18:00:00 via INTRAVENOUS
  Filled 2018-04-11: qty 720

## 2018-04-11 MED ORDER — PANTOPRAZOLE SODIUM 40 MG PO TBEC
40.0000 mg | DELAYED_RELEASE_TABLET | Freq: Every day | ORAL | Status: DC
  Administered 2018-04-11 – 2018-04-14 (×4): 40 mg via ORAL
  Filled 2018-04-11 (×4): qty 1

## 2018-04-11 MED ORDER — HYDROMORPHONE HCL 1 MG/ML IJ SOLN
INTRAMUSCULAR | Status: AC
  Filled 2018-04-11: qty 1

## 2018-04-11 MED ORDER — PREMIER PROTEIN SHAKE
11.0000 [oz_av] | Freq: Two times a day (BID) | ORAL | Status: DC
  Administered 2018-04-12 – 2018-04-13 (×3): 11 [oz_av] via ORAL

## 2018-04-11 MED ORDER — FAT EMULSION PLANT BASED 20 % IV EMUL
250.0000 mL | INTRAVENOUS | Status: AC
  Administered 2018-04-11: 250 mL via INTRAVENOUS
  Filled 2018-04-11: qty 250

## 2018-04-11 NOTE — Progress Notes (Signed)
SURGICAL PROGRESS NOTE   Hospital Day(s): 16.   Post op day(s): 6 Days Post-Op.   Interval History: Patient seen and examined, o acute events or new complaints overnight. Patient reports had multiple bowel movements. Patient refers having epigastric pain like the ones before surgery. Refers is ambulating. Did not like the full liquid but tolerated and no nausea.  Vital signs in last 24 hours: [min-max] current  Temp:  [98.3 F (36.8 C)-98.9 F (37.2 C)] 98.9 F (37.2 C) (09/15 0524) Pulse Rate:  [93-109] 109 (09/15 0524) Resp:  [15-20] 20 (09/15 0524) BP: (89-108)/(58-65) 89/58 (09/15 0524) SpO2:  [100 %] 100 % (09/15 0524) Weight:  [46.1 kg] 46.1 kg (09/14 1842)     Height: 5\' 4"  (162.6 cm) Weight: 46.1 kg BMI (Calculated): 17.45   Physical Exam:  Constitutional: alert, cooperative and no distress  Respiratory: breathing non-labored at rest  Cardiovascular: regular rate and sinus rhythm  Gastrointestinal: soft, non-tender, and non-distended. Wounds dry and clean.   Labs:  CBC Latest Ref Rng & Units 04/09/2018 04/06/2018 04/05/2018  WBC 3.6 - 11.0 K/uL 5.2 12.4(H) 3.7  Hemoglobin 12.0 - 16.0 g/dL 8.1(L) 8.2(L) 8.3(L)  Hematocrit 35.0 - 47.0 % 29.3(L) 25.5(L) 25.2(L)  Platelets 150 - 440 K/uL 235 201 200   CMP Latest Ref Rng & Units 04/11/2018 04/10/2018 04/09/2018  Glucose 70 - 99 mg/dL 92 04/11/2018) 782(N)  BUN 8 - 23 mg/dL 12 13 12   Creatinine 0.44 - 1.00 mg/dL 562(Z 3.08  Sodium 135 - 145 mmol/L 135 133(L) 135  Potassium 3.5 - 5.1 mmol/L 4.1 3.9 3.7  Chloride 98 - 111 mmol/L 101 100 98  CO2 22 - 32 mmol/L 28 28 29   Calcium 8.9 - 10.3 mg/dL 6.57) 8.46) 8.9  Total Protein 6.5 - 8.1 g/dL - - -  Total Bilirubin 0.3 - 1.2 mg/dL - - -  Alkaline Phos 38 - 126 U/L - - -  AST 15 - 41 U/L - - -  ALT 0 - 44 U/L - - -   Imaging studies: No new pertinent imaging studies  Assessment/Plan:  62 y.o.femalewith sigmoid stricture6Day Post-Ops/p laparoscopic hand assisted  sigmoidectomy and splenic flexure mobilization, complicated by pertinent comorbidities includingchronic anemia, improved malnutrition. Today patient had multiple bowel movements and continue passing gas. HR between 93-109 as it has been during admission. No fever. No abdominal distention. Post operative pain controlled with current oral medications. Epigastric pain most likely due to gastritis. Will add Protonix. Will progress diet to soft diet an assess for toleration. Discussed with Pharmacy to start weaning TPN most likely to discontinue tomorrow.  Patient ambulating.   9.6(E, MD

## 2018-04-11 NOTE — Consult Note (Signed)
PHARMACY - ADULT TOTAL PARENTERAL NUTRITION CONSULT NOTE   Pharmacy Consult for TPN Initiation and Management  Indication: Severe malnutrition  Patient Measurements: Height: 5\' 4"  (162.6 cm) Weight: 101 lb 11.2 oz (46.1 kg) IBW/kg (Calculated) : 54.7 TPN AdjBW (KG): 48.4 Body mass index is 17.46 kg/m.  Assessment: 62 yo female s/p sigmoidoscopy, which found signficant narrowing of sigmoid colon. Pharmacy was consulted to initiate TPN on POD 1 in 62 yo female with severe malnutrition in need of bowel surgery with anastomosis. Patient has had significant weight loss (20-30lbs over a few months) due to sigmoid mass, abdominal pain and possible partial obstruction due to the sigmoid stricture. POD 3  GI: GI recommends iron studies due to history of microcytic anemia, says inflammatory bowel disease panel is pending (which will not change course - a stricture biopsy/surgical specimen would me more indicative of crohn's disease). Endo: No Hx of DM, BG 115-135 Insulin requirements in the past 24 hours: 2 unit   Lytes:  Sodium (mmol/L)  Date Value  04/11/2018 135   Potassium (mmol/L)  Date Value  04/11/2018 4.1   Magnesium (mg/dL)  Date Value  04/13/2018 1.9   Phosphorus (mg/dL)  Date Value  78/58/8502 4.1   Calcium (mg/dL)  Date Value  77/41/2878 8.6 (L)   Albumin (g/dL)  Date Value  67/67/2094 2.4 (L)    Renal: Scr: 0.56 mg/dL  TPN Access: 70/96/2836 @ 6/29 TPN start date: 8/31 Nutritional Goals (per RD recommendation):  KCal: 1382 Protein: 72 g Fluid: 1620 mL  Goal TPN rate is: 60 mL/hr    Current Nutrition: TPN (at goal), advancing diet   Plan:  Patient will be started on soft diet to assess toleration and MD would like to reduce TPN rate by 1/2.   Will order Clinimix E 5/15 at 30 mL/hr with ILE 20% 15 ml/hr x 12 hours.   Add MVI, trace elements to TPN daily. Pt has completed thiamine x 3 days.   Electrolytes WNL for several days.   SSI resumed.  Pharmacy  will continue to follow.  Likely to be transitioned off TPN tomorrow am per RD.   6/15, PharmD  Clinical Pharmacist  04/11/2018 11:06 AM

## 2018-04-12 LAB — CBC
HCT: 24.3 % — ABNORMAL LOW (ref 35.0–47.0)
Hemoglobin: 8 g/dL — ABNORMAL LOW (ref 12.0–16.0)
MCH: 25.1 pg — AB (ref 26.0–34.0)
MCHC: 33 g/dL (ref 32.0–36.0)
MCV: 76 fL — AB (ref 80.0–100.0)
PLATELETS: 205 10*3/uL (ref 150–440)
RBC: 3.19 MIL/uL — ABNORMAL LOW (ref 3.80–5.20)
RDW: 17.6 % — AB (ref 11.5–14.5)
WBC: 4.2 10*3/uL (ref 3.6–11.0)

## 2018-04-12 LAB — DIFFERENTIAL
BAND NEUTROPHILS: 0 %
BASOS ABS: 0 10*3/uL (ref 0–0.1)
Basophils Relative: 0 %
Blasts: 0 %
Eosinophils Absolute: 0.1 10*3/uL (ref 0–0.7)
Eosinophils Relative: 3 %
LYMPHS ABS: 1.2 10*3/uL (ref 1.0–3.6)
LYMPHS PCT: 28 %
MONO ABS: 0.2 10*3/uL (ref 0.2–0.9)
Metamyelocytes Relative: 0 %
Monocytes Relative: 5 %
Myelocytes: 0 %
Neutro Abs: 2.7 10*3/uL (ref 1.4–6.5)
Neutrophils Relative %: 64 %
OTHER: 0 %
PROMYELOCYTES RELATIVE: 0 %
nRBC: 0 /100 WBC

## 2018-04-12 LAB — COMPREHENSIVE METABOLIC PANEL
ALBUMIN: 2.5 g/dL — AB (ref 3.5–5.0)
ALK PHOS: 138 U/L — AB (ref 38–126)
ALT: 79 U/L — ABNORMAL HIGH (ref 0–44)
AST: 37 U/L (ref 15–41)
Anion gap: 6 (ref 5–15)
BILIRUBIN TOTAL: 0.4 mg/dL (ref 0.3–1.2)
BUN: 11 mg/dL (ref 8–23)
CALCIUM: 8.5 mg/dL — AB (ref 8.9–10.3)
CO2: 28 mmol/L (ref 22–32)
CREATININE: 0.58 mg/dL (ref 0.44–1.00)
Chloride: 101 mmol/L (ref 98–111)
GFR calc Af Amer: >60 mL/min (ref >60)
GFR calc non Af Amer: >60 mL/min (ref >60)
GLUCOSE: 99 mg/dL (ref 70–99)
Potassium: 3.9 mmol/L (ref 3.5–5.1)
Sodium: 135 mmol/L (ref 135–145)
TOTAL PROTEIN: 7.1 g/dL (ref 6.5–8.1)

## 2018-04-12 LAB — GLUCOSE, CAPILLARY
GLUCOSE-CAPILLARY: 104 mg/dL — AB (ref 70–99)
Glucose-Capillary: 97 mg/dL (ref 70–99)

## 2018-04-12 LAB — PREALBUMIN: Prealbumin: 21.9 mg/dL (ref 18–38)

## 2018-04-12 LAB — TRIGLYCERIDES: TRIGLYCERIDES: 67 mg/dL (ref <150)

## 2018-04-12 LAB — PHOSPHORUS: Phosphorus: 4.5 mg/dL (ref 2.5–4.6)

## 2018-04-12 LAB — MAGNESIUM: Magnesium: 1.8 mg/dL (ref 1.7–2.4)

## 2018-04-12 MED ORDER — SUCRALFATE 1 GM/10ML PO SUSP
1.0000 g | Freq: Three times a day (TID) | ORAL | Status: DC
  Administered 2018-04-12 – 2018-04-14 (×7): 1 g via ORAL
  Filled 2018-04-12 (×8): qty 10

## 2018-04-12 MED ORDER — GABAPENTIN 250 MG/5ML PO SOLN
300.0000 mg | Freq: Two times a day (BID) | ORAL | Status: DC
  Filled 2018-04-12 (×2): qty 6

## 2018-04-12 MED ORDER — ONDANSETRON HCL 4 MG/2ML IJ SOLN
4.0000 mg | Freq: Four times a day (QID) | INTRAMUSCULAR | Status: DC | PRN
  Administered 2018-04-12: 4 mg via INTRAVENOUS
  Filled 2018-04-12: qty 2

## 2018-04-12 MED ORDER — ADULT MULTIVITAMIN W/MINERALS CH
1.0000 | ORAL_TABLET | Freq: Every day | ORAL | Status: DC
  Administered 2018-04-13: 1 via ORAL
  Filled 2018-04-12: qty 1

## 2018-04-12 MED ORDER — GABAPENTIN 300 MG PO CAPS
300.0000 mg | ORAL_CAPSULE | Freq: Two times a day (BID) | ORAL | Status: DC
  Administered 2018-04-12 (×2): 300 mg via ORAL
  Filled 2018-04-12 (×2): qty 1

## 2018-04-12 MED ORDER — DOCUSATE SODIUM 100 MG PO CAPS
100.0000 mg | ORAL_CAPSULE | Freq: Every day | ORAL | Status: DC
  Administered 2018-04-12: 100 mg via ORAL
  Filled 2018-04-12: qty 1

## 2018-04-12 MED ORDER — FAMOTIDINE 20 MG PO TABS
20.0000 mg | ORAL_TABLET | Freq: Two times a day (BID) | ORAL | Status: DC
  Administered 2018-04-12 – 2018-04-14 (×5): 20 mg via ORAL
  Filled 2018-04-12 (×5): qty 1

## 2018-04-12 NOTE — Progress Notes (Signed)
PHARMACIST - PHYSICIAN COMMUNICATION  DR:   Hazle Quant  CONCERNING: IV to Oral Route Change Policy  RECOMMENDATION: This patient is receiving famotidine by the intravenous route.  Based on criteria approved by the Pharmacy and Therapeutics Committee, the intravenous medication(s) is/are being converted to the equivalent oral dose form(s).   DESCRIPTION: These criteria include:  The patient is eating (either orally or via tube) and/or has been taking other orally administered medications for a least 24 hours  The patient has no evidence of active gastrointestinal bleeding or impaired GI absorption (gastrectomy, short bowel, patient on TNA or NPO).  If you have questions about this conversion, please contact the Pharmacy Department  []   [phone] )  ( [phone] [x]   [phone] )  [facility] []   [phone] )  [facility] []   [phone] )  [facility] []   [phone] )  [facility]   ( [phone], PharmD 04/12/2018 10:45 AM

## 2018-04-12 NOTE — Progress Notes (Signed)
Nutrition Follow Up Note   DOCUMENTATION CODES:   Severe malnutrition in context of acute illness/injury  INTERVENTION:   Discontinue Clinimix 5/15 with electrolytes   Disontinue 20% lipids   Continue MVI daily   Premier Protein BID, each supplement provides 160 kcal and 30 grams of protein.   NUTRITION DIAGNOSIS:   Severe Malnutrition related to acute illness as evidenced by 22% percent weight loss in <5 months, mild to moderate fat depletions, severe muscle depletions.  GOAL:   Patient will meet greater than or equal to 90% of their needs  -met with TPN  MONITOR:   PO intake, Supplement acceptance, Labs, Weight trends, Skin, I & O's  ASSESSMENT:   62 y.o. female with a known history of collagen vascular disease, rheumatoid arthritis, chronic anemia who presents with sigmoid stricture of unknown etiology   Pt advanced to full liquid diet 9/14 and then soft diet 9/15. Pt eating only sips and bites of meals as she reports that she does not like the food. Premier Protein ordered; pt is drinking this. Pt denies any nausea or vomiting but does reports some abdominal pain today. Spoke to MD, plan is to discontinue TPN tonight. RD will order MVI daily. Per chart, pt with 3-4lb weight gain since admit. Recommend continue supplements and MVI after discharge.    Medications reviewed and include: colace, pepcid, insulin, MVI, protonix, carafate  Labs reviewed: K 3.9 wnl, P 4.5 wnl, Mg 1.8 wnl Hgb 8.0(L), Hct 24.3(L)  Diet Order:   Diet Order            DIET SOFT Room service appropriate? Yes; Fluid consistency: Thin  Diet effective now             EDUCATION NEEDS:   Education needs have been addressed  Skin:  Skin Assessment: Reviewed RN Assessment(ecchymosis )  Last BM:  9/15- TYPE 7  Height:   Ht Readings from Last 1 Encounters:  04/05/18 5' 4"  (1.626 m)    Weight:   Wt Readings from Last 1 Encounters:  04/12/18 47.4 kg    Ideal Body Weight:  54.5  kg  BMI:  Body mass index is 17.92 kg/m.  Estimated Nutritional Needs:   Kcal:  1300-1500kcal/day   Protein:  70-78g/day   Fluid:  >1.3L/day   Koleen Distance MS, RD, LDN Pager #- [phone] Office#- [phone] After Hours Pager: [phone]

## 2018-04-12 NOTE — Progress Notes (Signed)
SURGICAL PROGRESS NOTE   Hospital Day(s): 17.   Post op day(s): 7 Days Post-Op.   Interval History: Patient seen and examined, no acute events or new complaints overnight. Patient reports still having right sided abdominal pain referred as the pain that she had before being admitted to the hospital, denies nausea or vomiting. Refers ate small amount of soft diet because it tasted bad. Best meal was half a grilled cheese.   Vital signs in last 24 hours: [min-max] current  Temp:  [98.7 F (37.1 C)-99.2 F (37.3 C)] 98.7 F (37.1 C) (09/16 0800) Pulse Rate:  [86-93] 93 (09/16 0800) Resp:  [14-18] 18 (09/16 0800) BP: (97-104)/(59-65) 100/63 (09/16 0800) SpO2:  [100 %] 100 % (09/16 0800) Weight:  [47.4 kg] 47.4 kg (09/16 0500)     Height: 5\' 4"  (162.6 cm) Weight: 47.4 kg BMI (Calculated): 17.91   Physical Exam:  Constitutional: alert, cooperative and no distress  Respiratory: breathing non-labored at rest  Cardiovascular: regular rate and sinus rhythm  Gastrointestinal: soft, non-tender, and non-distended. Wound dry and clean.   Labs:  CBC Latest Ref Rng & Units 04/12/2018 04/09/2018 04/06/2018  WBC 3.6 - 11.0 K/uL 4.2 5.2 12.4(H)  Hemoglobin 12.0 - 16.0 g/dL 8.0(L) 8.1(L) 8.2(L)  Hematocrit 35.0 - 47.0 % 24.3(L) 29.3(L) 25.5(L)  Platelets 150 - 440 K/uL 205 235 201   CMP Latest Ref Rng & Units 04/12/2018 04/11/2018 04/10/2018  Glucose 70 - 99 mg/dL 99 92 04/12/2018)  BUN 8 - 23 mg/dL 11 12 13   Creatinine 0.44 - 1.00 mg/dL 197(J 8.83  Sodium 135 - 145 mmol/L 135 135 133(L)  Potassium 3.5 - 5.1 mmol/L 3.9 4.1 3.9  Chloride 98 - 111 mmol/L 101 101 100  CO2 22 - 32 mmol/L 28 28 28   Calcium 8.9 - 10.3 mg/dL 2.54) 9.82) )  Total Protein 6.5 - 8.1 g/dL 7.1 - -  Total Bilirubin 0.3 - 1.2 mg/dL 0.4 - -  Alkaline Phos 38 - 126 U/L 138(H) - -  AST 15 - 41 U/L 37 - -  ALT 0 - 44 U/L 79(H) - -    Imaging studies: No new pertinent imaging studies   Assessment/Plan:  62  y.o.femalewith sigmoid stricture7Day Post-Ops/p laparoscopic hand assisted sigmoidectomy and splenic flexure mobilization, complicated by pertinent comorbidities includingchronic anemia, improved malnutrition. Patient continue to have bowel movements and passing gas. Unable to eat adequate amount of food to meet nutritional status. Patient will try the chicken today. Will continue with decrease of TPN and try to discontinue today.  Pain described as the same pain since before admission will treat with Carafate and gabapentin to keep optimizing her pain management. Patient is uncomfortable and anxious about this pain.  Adequate WBC, hemoglobin stable with chronic anemia. No significant electrolyte disturbances. Encourage to ambulate, will follow pain response to pain medications.   5.8(X, MD

## 2018-04-13 LAB — URINALYSIS, ROUTINE W REFLEX MICROSCOPIC
BILIRUBIN URINE: NEGATIVE
Glucose, UA: NEGATIVE mg/dL
Hgb urine dipstick: NEGATIVE
KETONES UR: NEGATIVE mg/dL
LEUKOCYTES UA: NEGATIVE
NITRITE: NEGATIVE
PH: 5 (ref 5.0–8.0)
PROTEIN: NEGATIVE mg/dL
Specific Gravity, Urine: 1.013 (ref 1.005–1.030)

## 2018-04-13 NOTE — Progress Notes (Signed)
   04/13/18 1100  Clinical Encounter Type  Visited With Patient  Visit Type Follow-up;Spiritual support  Recommendations Follow-up as needed.  Spiritual Encounters  Spiritual Needs Emotional  Stress Factors  Patient Stress Factors  (Stress is declining as she improves.)   Chaplain encountered patient as she was walking around the unit. Patient appears more positive and is getting stronger. Patient noted that she was happy that her son visited. He is afraid of hospitals (especially seeing his mother connected to IVs etc). During her son's visit some of the family dynamics were addressed. Patient is relieved by the improvement. Chaplain provided active listening, reflective questions, and emotional support.

## 2018-04-13 NOTE — Progress Notes (Signed)
SURGICAL PROGRESS NOTE   Hospital Day(s): 18.   Post op day(s): 8 Days Post-Op.   Interval History: Patient seen and examined, no acute events or new complaints overnight. Patient reports having suprapubic pain and burning on urination. Denies nausea and vomiting. Tolerated ham and cheese sandwich once. Still having significant post op pain difficult to control with oral pain medication.   Vital signs in last 24 hours: [min-max] current  Temp:  [98.2 F (36.8 C)-98.9 F (37.2 C)] 98.9 F (37.2 C) (09/17 0432) Pulse Rate:  [84-96] 84 (09/17 0432) Resp:  [16-18] 16 (09/17 0432) BP: (95-101)/(60-66) 99/66 (09/17 0432) SpO2:  [100 %] 100 % (09/17 0432) Weight:  [48.6 kg] 48.6 kg (09/17 0432)     Height: 5\' 4"  (162.6 cm) Weight: 48.6 kg BMI (Calculated): 18.39   Physical Exam:  Constitutional: alert, cooperative and no distress  Respiratory: breathing non-labored at rest  Cardiovascular: regular rate and sinus rhythm  Gastrointestinal: soft, non-tender, and non-distended. Wound dry and clean. Bowel sound present and normal.   Labs:  CBC Latest Ref Rng & Units 04/12/2018 04/09/2018 04/06/2018  WBC 3.6 - 11.0 K/uL 4.2 5.2 12.4(H)  Hemoglobin 12.0 - 16.0 g/dL 8.0(L) 8.1(L) 8.2(L)  Hematocrit 35.0 - 47.0 % 24.3(L) 29.3(L) 25.5(L)  Platelets 150 - 440 K/uL 205 235 201   CMP Latest Ref Rng & Units 04/12/2018 04/11/2018 04/10/2018  Glucose 70 - 99 mg/dL 99 92 04/12/2018)  BUN 8 - 23 mg/dL 11 12 13   Creatinine 0.44 - 1.00 mg/dL 384(Y 6.59  Sodium 135 - 145 mmol/L 135 135 133(L)  Potassium 3.5 - 5.1 mmol/L 3.9 4.1 3.9  Chloride 98 - 111 mmol/L 101 101 100  CO2 22 - 32 mmol/L 28 28 28   Calcium 8.9 - 10.3 mg/dL 9.35) 7.01) )  Total Protein 6.5 - 8.1 g/dL 7.1 - -  Total Bilirubin 0.3 - 1.2 mg/dL 0.4 - -  Alkaline Phos 38 - 126 U/L 138(H) - -  AST 15 - 41 U/L 37 - -  ALT 0 - 44 U/L 79(H) - -    Imaging studies: No new pertinent imaging studies   Assessment/Plan:  62  y.o.femalewith sigmoid stricture8Day Post-Ops/p laparoscopic hand assisted sigmoidectomy and splenic flexure mobilization, complicated by pertinent comorbidities includingchronic anemia, improved malnutrition. Patient continue to have bowel movements and passing gas. First day that tolerated more solid food, one ham and cheese sandwich, still not meeting enough nutrition. Hopefully today will eat more. TPN discontinued and tolerated without hypoglycemia.  New pain today on suprapubic area added to the right abdominal pain. Will order urinalysis to rule out urine infection.  Encourage to ambulate, will follow pain response to pain medications and urinalysis.   3.9(Q, MD

## 2018-04-14 MED ORDER — SUCRALFATE 1 GM/10ML PO SUSP: 1.0000 g | Freq: Three times a day (TID) | ORAL | 0 refills | Status: AC

## 2018-04-14 MED ORDER — FAMOTIDINE 20 MG PO TABS: 20.0000 mg | ORAL_TABLET | Freq: Two times a day (BID) | ORAL | 0 refills | Status: AC

## 2018-04-14 MED ORDER — HYDROCODONE-ACETAMINOPHEN 5-325 MG PO TABS: 1.0000 | ORAL_TABLET | ORAL | 0 refills | Status: AC | PRN

## 2018-04-14 NOTE — Care Management (Signed)
Patient discharged home today.  TPN has been weaned, and patient tolerating diet.  Patient is independent of all ADL's and ambulating around the nursing station.  Per MD and bedside RN no needs at discharge.

## 2018-04-14 NOTE — Plan of Care (Signed)
Nutrition Education Note  RD consulted for nutrition education regarding a low fat, low fiber diet   RD provided "Low Fiber and Low Fat Nutrition Therapy" handouts from the Academy of [employer]etics. Reviewed patient's dietary recall. Provided examples on ways to decrease fiber intake in the diet such as avoiding whole grains, seeds, nuts, raw vegetables, fruits with skin and connective tissue of meats. Also advised limited fats in the diet such as oils, butter, fatty meats, fried foods and "junk foods". Advised patient to avoid sugar, artificial sweeteners, acidic/spicy foods, and caffeine. Discussed with patient the importance of having adequate amounts of lean protein and avoiding saturated fats. Gave patient examples of meals and menu planning.   Teach back method used.  Expect good compliance.  Body mass index is 17.73 kg/m. Pt meets criteria for underweight based on current BMI.  RD following this pt  Betsey Holiday MS, RD, LDN Pager #[phone] Office#- [phone] After Hours Pager: [phone]

## 2018-04-14 NOTE — Discharge Summary (Signed)
SURGICAL PROGRESS NOTE   Hospital Day(s): 19.   Post op day(s): 9 Days Post-Op.   Interval History: Patient seen and examined, no acute events or new complaints overnight. Patient reports feeling better today. Pains has been controlled. Patient refers had a good lunch yesterday for the first time. Denies nausea or vomiting.   Vital signs in last 24 hours: [min-max] current  Temp:  [98.8 F (37.1 C)-99.6 F (37.6 C)] 99.1 F (37.3 C) (09/18 0447) Pulse Rate:  [90-105] 105 (09/18 0447) Resp:  [16-20] 20 (09/18 0447) BP: (94-111)/(63-71) 111/71 (09/18 0447) SpO2:  [100 %] 100 % (09/18 0447) Weight:  [46.9 kg] 46.9 kg (09/18 0447)     Height: 5\' 4"  (162.6 cm) Weight: 46.9 kg BMI (Calculated): 17.72   Physical Exam:  Constitutional: alert, cooperative and no distress  Respiratory: breathing non-labored at rest  Cardiovascular: regular rate and sinus rhythm  Gastrointestinal: soft, non-tender, and non-distended. Wound dry and clean.   Labs:  CBC Latest Ref Rng & Units 04/12/2018 04/09/2018 04/06/2018  WBC 3.6 - 11.0 K/uL 4.2 5.2 12.4(H)  Hemoglobin 12.0 - 16.0 g/dL 8.0(L) 8.1(L) 8.2(L)  Hematocrit 35.0 - 47.0 % 24.3(L) 29.3(L) 25.5(L)  Platelets 150 - 440 K/uL 205 235 201   CMP Latest Ref Rng & Units 04/12/2018 04/11/2018 04/10/2018  Glucose 70 - 99 mg/dL 99 92 04/12/2018)  BUN 8 - 23 mg/dL 11 12 13   Creatinine 0.44 - 1.00 mg/dL 354(S 5.68  Sodium 135 - 145 mmol/L 135 135 133(L)  Potassium 3.5 - 5.1 mmol/L 3.9 4.1 3.9  Chloride 98 - 111 mmol/L 101 101 100  CO2 22 - 32 mmol/L 28 28 28   Calcium 8.9 - 10.3 mg/dL 1.27) 5.17) )  Total Protein 6.5 - 8.1 g/dL 7.1 - -  Total Bilirubin 0.3 - 1.2 mg/dL 0.4 - -  Alkaline Phos 38 - 126 U/L 138(H) - -  AST 15 - 41 U/L 37 - -  ALT 0 - 44 U/L 79(H) - -    Imaging studies: No new pertinent imaging studies   Assessment/Plan:  62 y.o.femalewith sigmoid stricture9Day Post-Ops/p laparoscopic hand assisted sigmoidectomy and splenic  flexure mobilization, complicated by pertinent comorbidities includingchronic anemia, improved malnutrition. Malnutrition treated with TPN. Now tolerating soft diet.  Patient continue to have bowel movements and passing gas. Tolerated soft diet.  Patient refers pain today are control.  Encourage to ambulate, will follow pain response to pain medications and urinalysis.  Allergies as of 04/14/2018   No Known Allergies     Medication List    TAKE these medications   famotidine 20 MG tablet Commonly known as:  PEPCID Take 1 tablet (20 mg total) by mouth 2 (two) times daily.   HYDROcodone-acetaminophen 5-325 MG tablet Commonly known as:  NORCO/VICODIN Take 1 tablet by mouth every 4 (four) hours as needed for up to 3 days for moderate pain.   hydroxychloroquine 200 MG tablet Commonly known as:  PLAQUENIL Take 200 mg by mouth every other day.   meloxicam 7.5 MG tablet Commonly known as:  MOBIC Take 7.5 mg by mouth daily as needed for pain.   methylPREDNISolone 4 MG tablet Commonly known as:  MEDROL Take 4 mg by mouth daily.   polyethylene glycol-electrolytes 420 g solution Commonly known as:  NuLYTELY/GoLYTELY Take 4,000 mLs by mouth once.   promethazine 12.5 MG tablet Commonly known as:  PHENERGAN Take 1 tablet (12.5 mg total) by mouth every 6 (six) hours as needed for nausea or vomiting.  sucralfate 1 GM/10ML suspension Commonly known as:  CARAFATE Take 10 mLs (1 g total) by mouth 4 (four) times daily -  with meals and at bedtime.   traMADol 50 MG tablet Commonly known as:  ULTRAM Take 1 tablet (50 mg total) by mouth every 6 (six) hours as needed.       Gae Gallop, MD

## 2018-04-14 NOTE — Progress Notes (Signed)
Discharge order received. Patient is alert and oriented. Heart rate was 110 radial..Surgeon was aware and re-assessed pt. Status. No signs of acute distress. Discharge instructions given. Patient verbalized understanding. Continue to discharge pt.

## 2018-04-20 ENCOUNTER — Telehealth: Payer: Self-pay

## 2018-04-20 NOTE — Telephone Encounter (Signed)
Flagged on EMMI report for not taking meds.  Called and spoke with patient, who reported she is taking all her meds.  Said she has missed a dose once, but otherwise doing well.  Stated there was some confusion with the question that may have resulted in the wrong answer.  I thanked her for her feedback and for participating in the [facility] callbacks.

## 2019-12-09 IMAGING — CR DG ABDOMEN ACUTE W/ 1V CHEST
1 series · 3 of 3 positions shown · non-contrast
Comparison: CT scan and radiographs of February 03, 2018.

CLINICAL DATA: Lower abdominal pain.

EXAM:
DG ABDOMEN ACUTE W/ 1V CHEST

[Series 1: dg abd acute w/chest · 0.14mm/px · 3 of 3 slices shown]
[im 1/3]
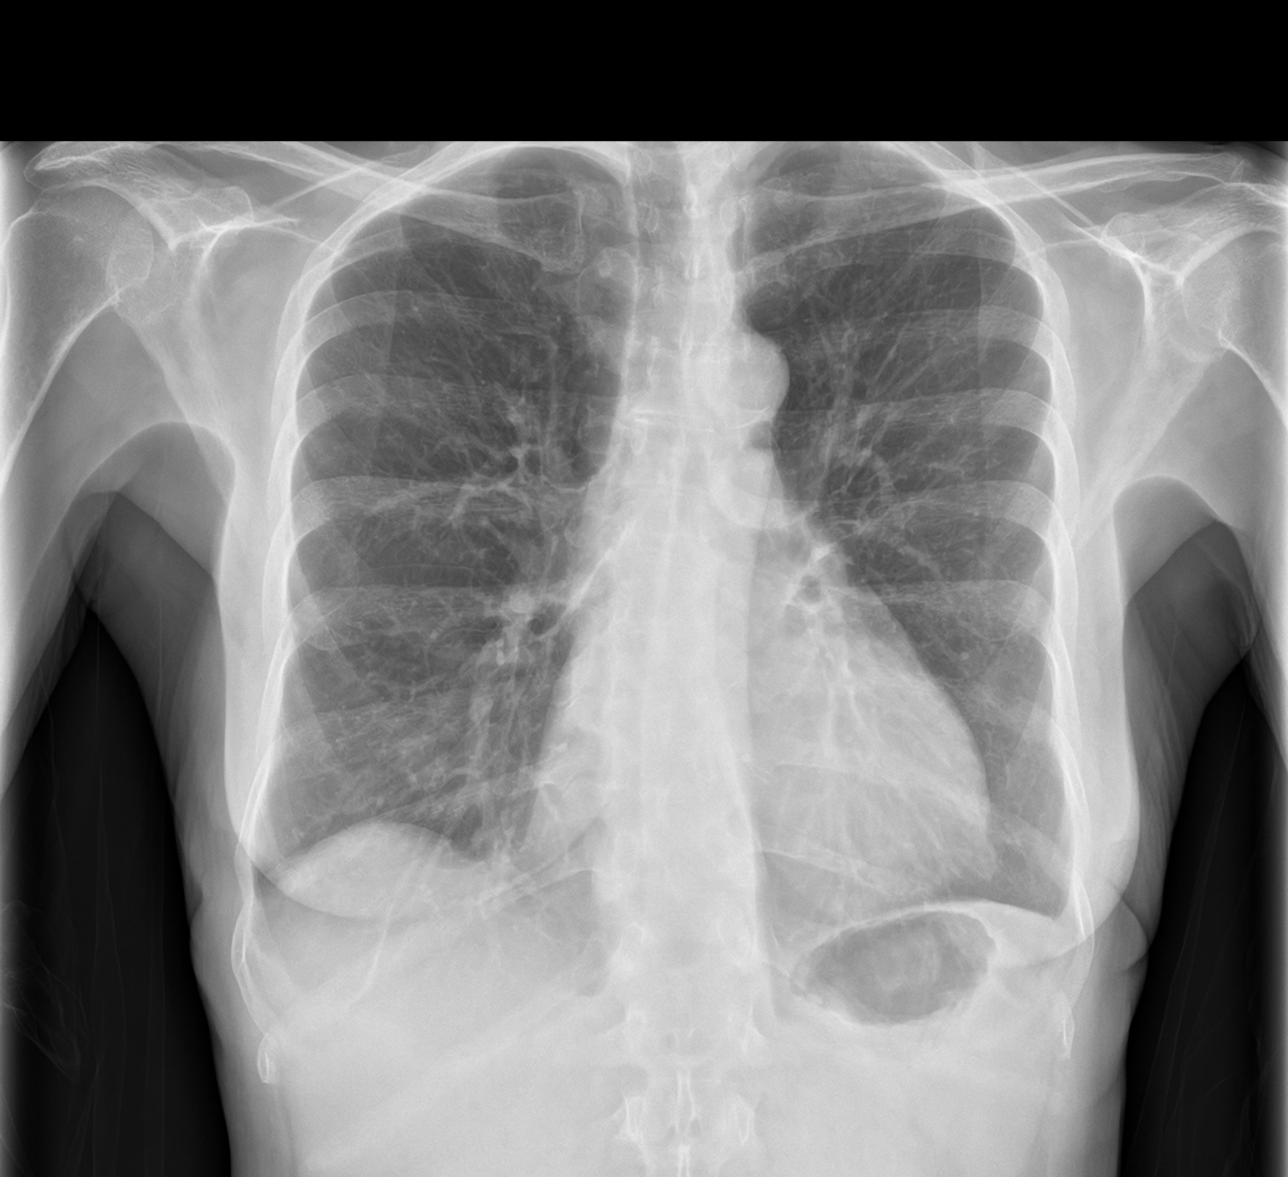
[im 2/3]
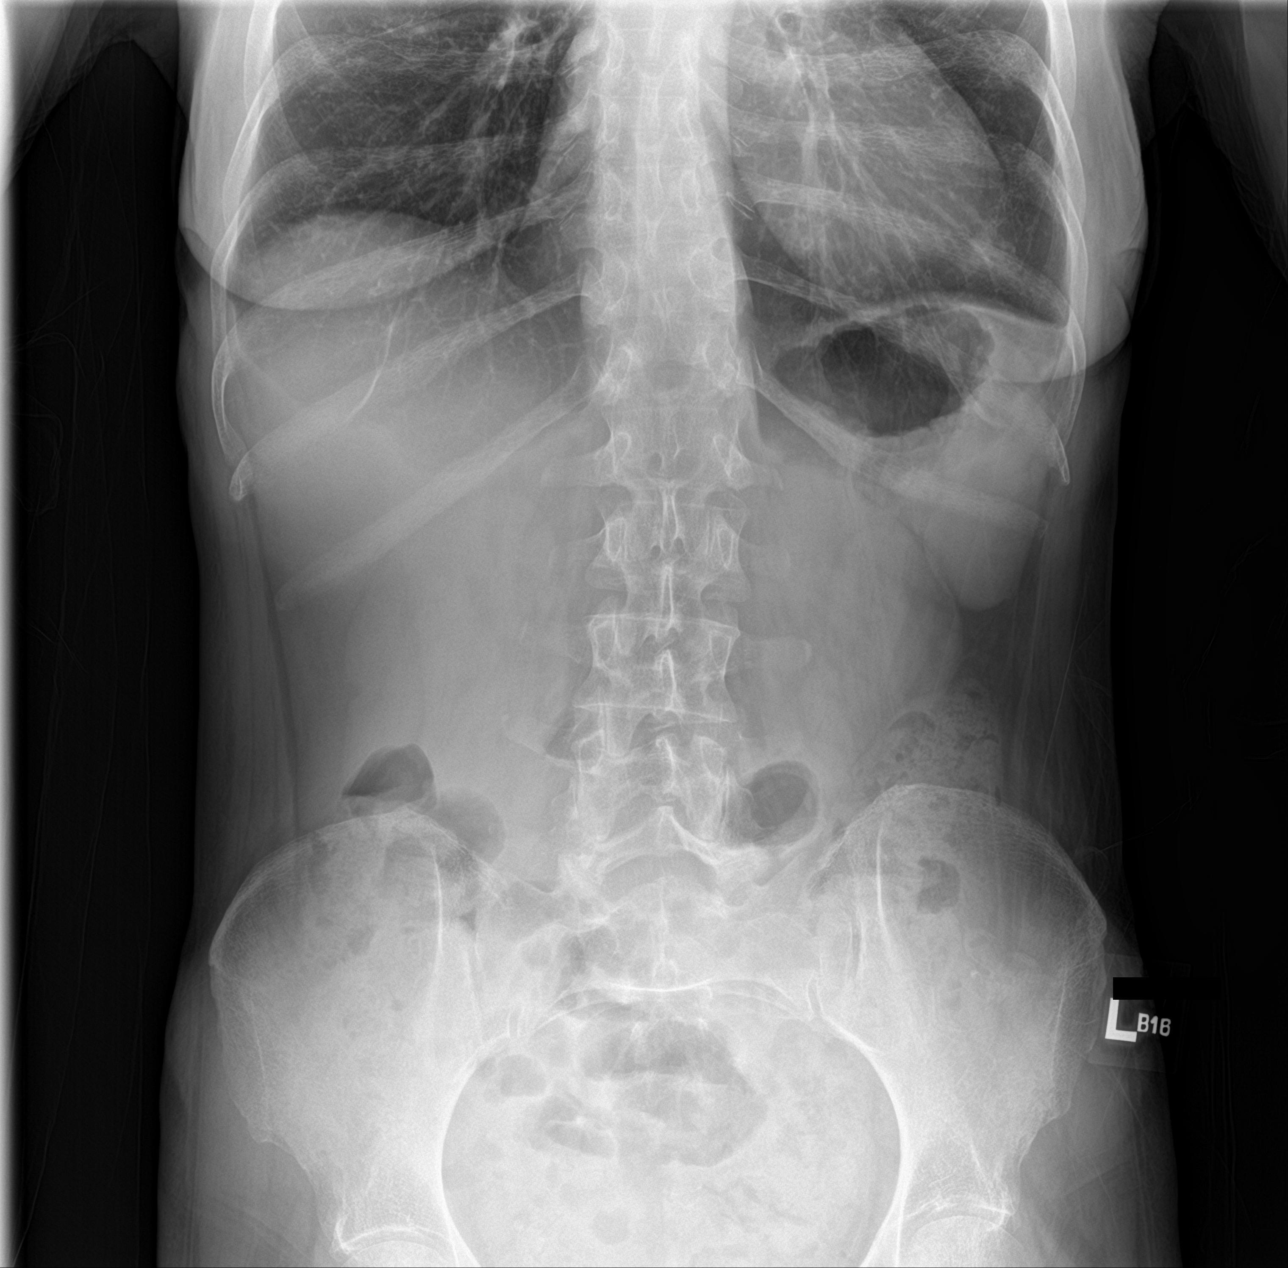
[im 3/3]
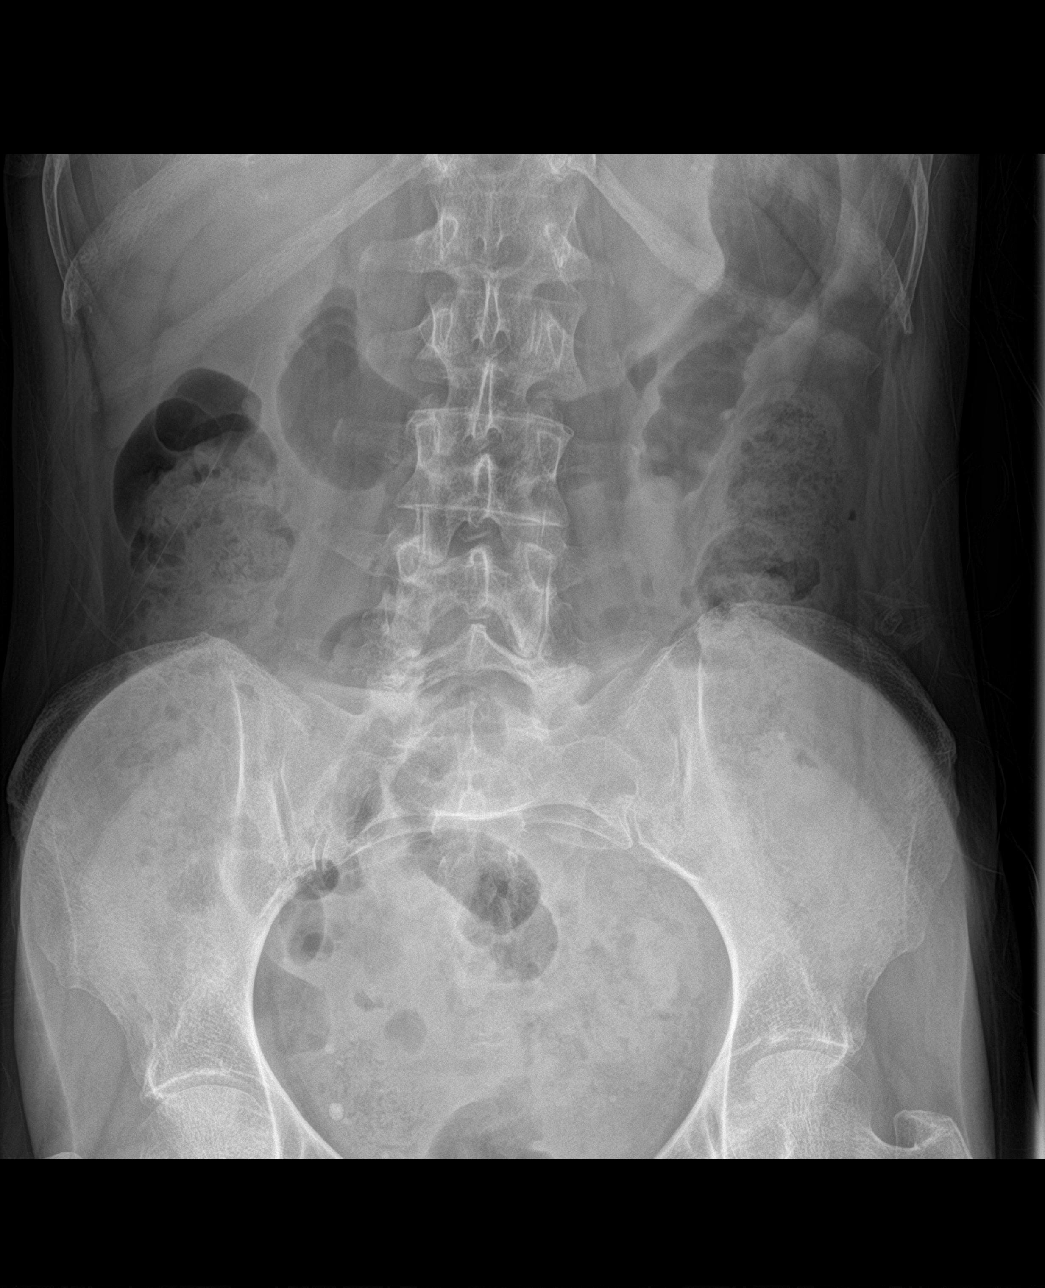

[3 of 3 positions shown; findings below may reference images not displayed]

FINDINGS: There is no evidence of dilated bowel loops or free intraperitoneal
air. Moderate amount of stool seen throughout the colon. Phleboliths
are noted in the pelvis. Heart size and mediastinal contours are
within normal limits. Both lungs are clear.
IMPRESSION: Moderate stool burden. No evidence of bowel obstruction or ileus. No
acute cardiopulmonary disease.

## 2019-12-11 IMAGING — CT CT ABD-PELV W/ CM
2 of 5 series · 14 of 46 positions shown, 16 images · IV contrast (APPLIED)
Comparison: Most recent prior CT scan of the abdomen and pelvis
02/03/2018 and 06/17/2017

CLINICAL DATA: 62-year-old female with generalized abdominal pain,
nausea, vomiting and unintentional weight loss of approximately 20
pounds over the past month. Patient underwent colonoscopy yesterday.

EXAM:
CT ABDOMEN AND PELVIS WITH CONTRAST
TECHNIQUE: Multidetector CT imaging of the abdomen and pelvis was performed
using the standard protocol following bolus administration of
intravenous contrast.
CONTRAST:  75mL OMNIPAQUE IOHEXOL 300 MG/ML  SOLN

[Series 2: routine abd/pel with · axial · 0.57mm/px · z∈[-464,-104]mm · 11 of 82 slices shown, 13 images]
[im 5/82  soft-tissue]
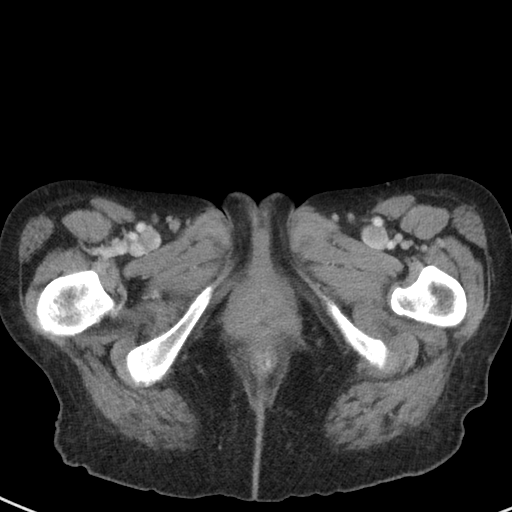
[im 5/82  bone]
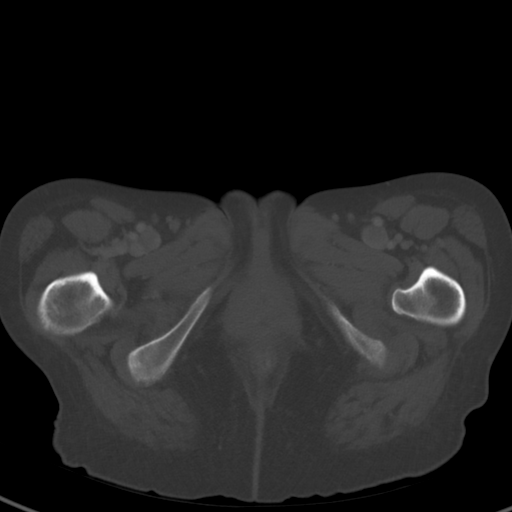
[im 13/82  soft-tissue]
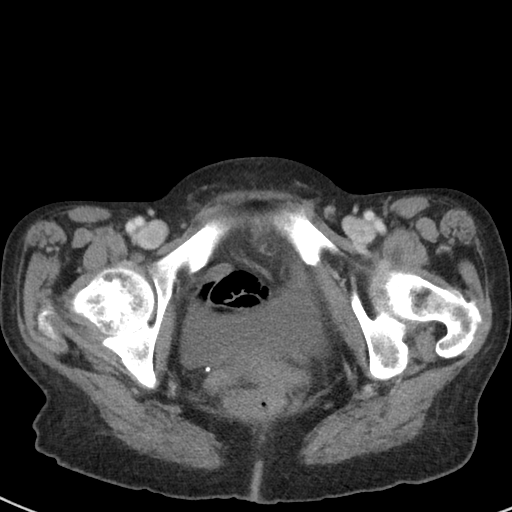
[im 22/82  soft-tissue]
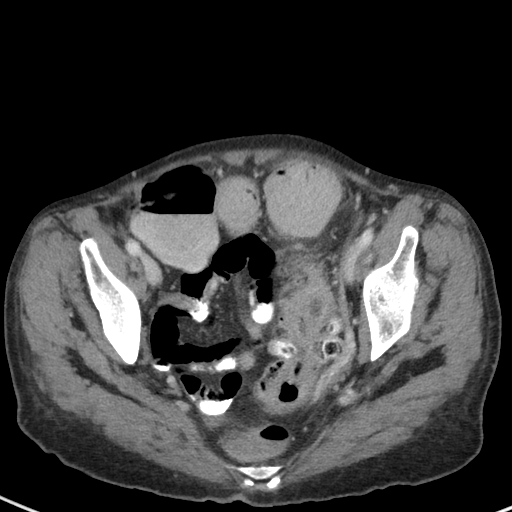
[im 26/82  soft-tissue]
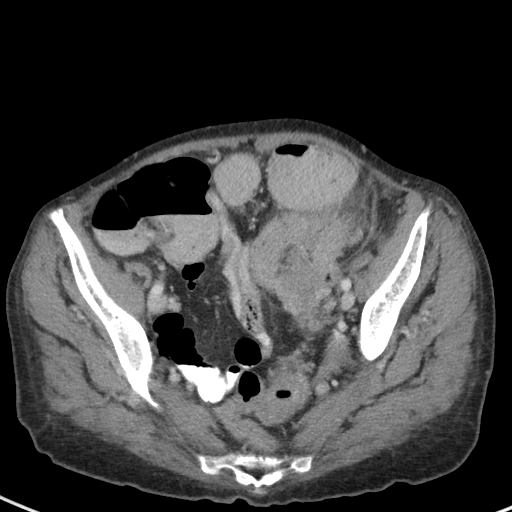
[im 35/82  soft-tissue]
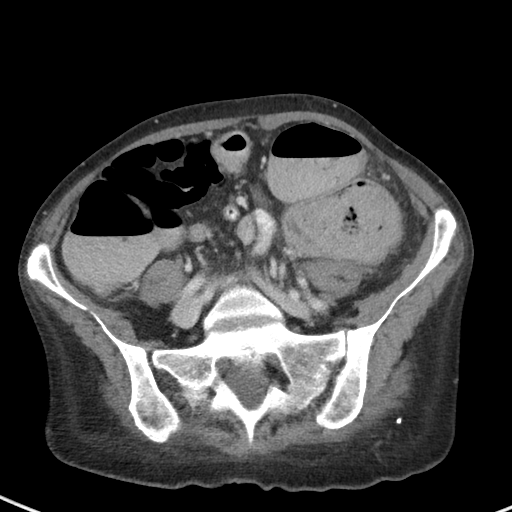
[im 43/82  soft-tissue]
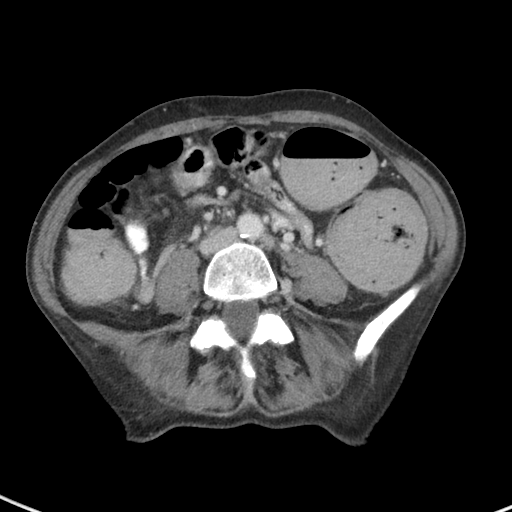
[im 47/82  soft-tissue]
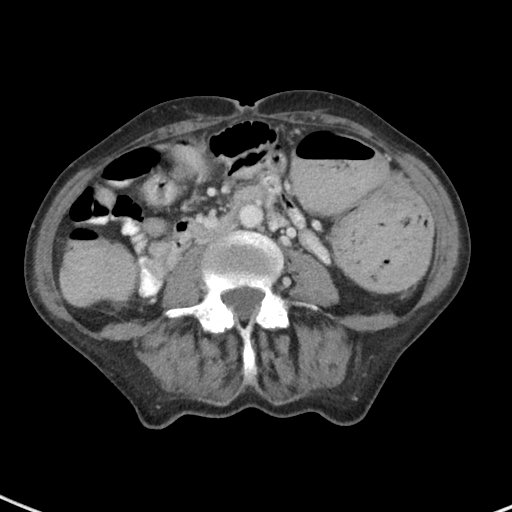
[im 56/82  soft-tissue]
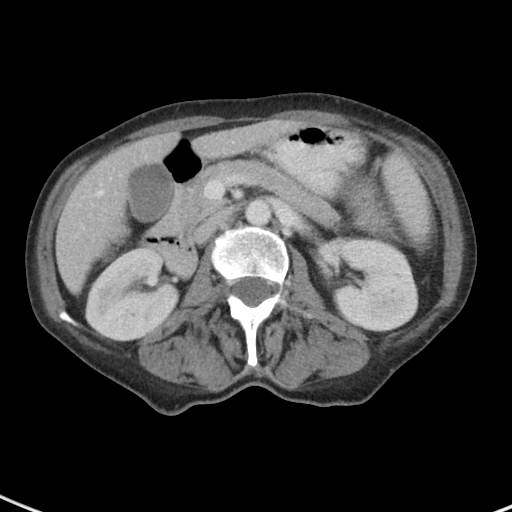
[im 60/82  soft-tissue]
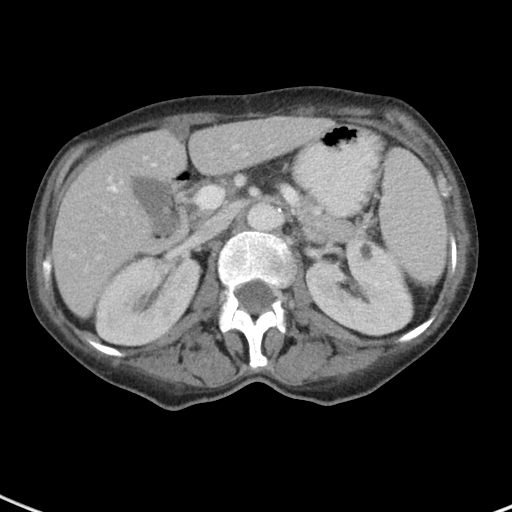
[im 60/82  bone]
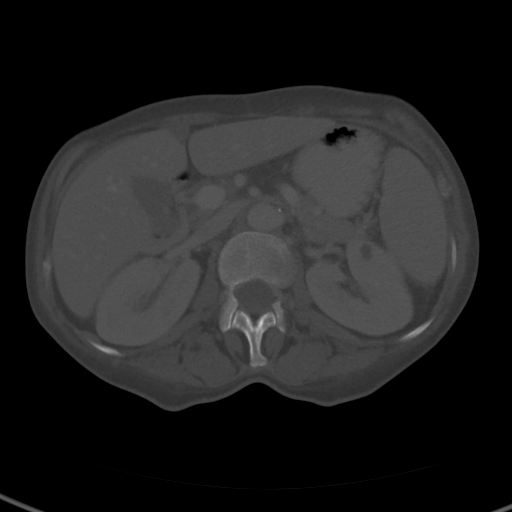
[im 69/82  soft-tissue]
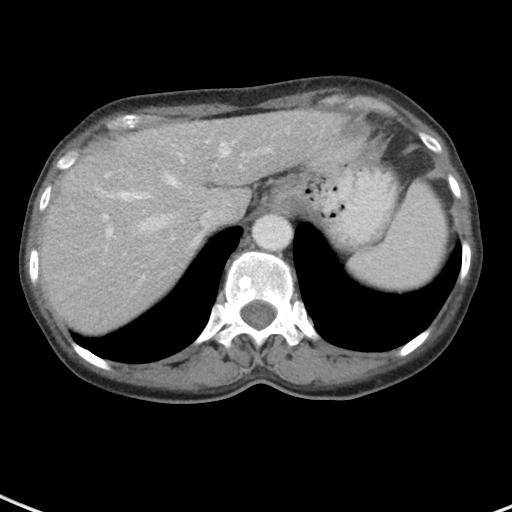
[im 77/82  soft-tissue]
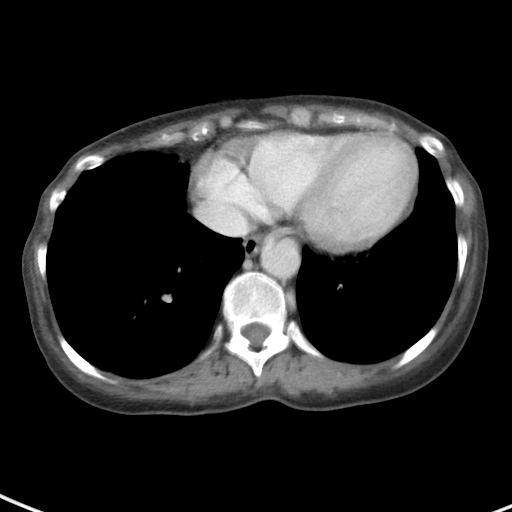

[Series 5: coronal st · coronal · 0.58mm/px · 3 of 82 slices shown]
[im 28/82  soft-tissue]
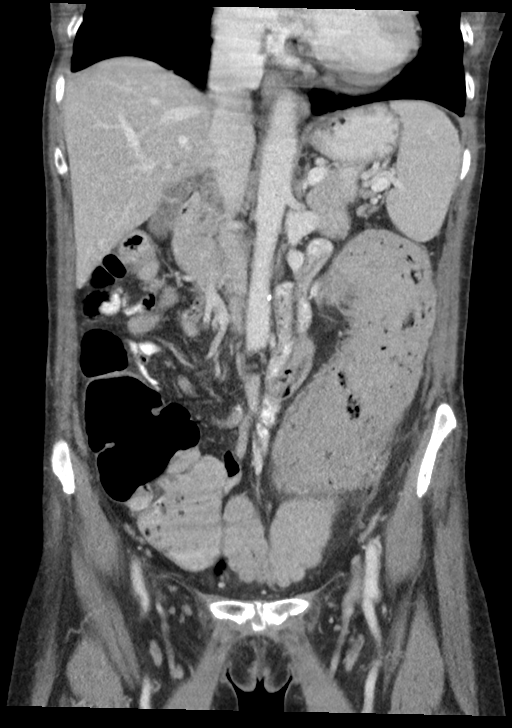
[im 37/82  soft-tissue]
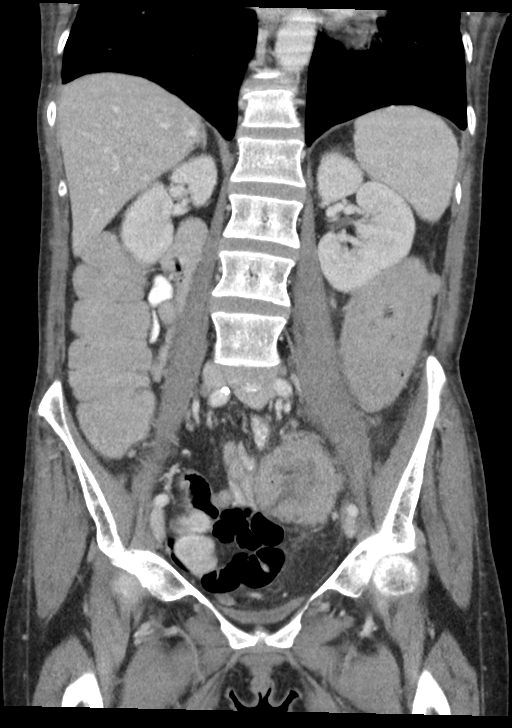
[im 46/82  soft-tissue]
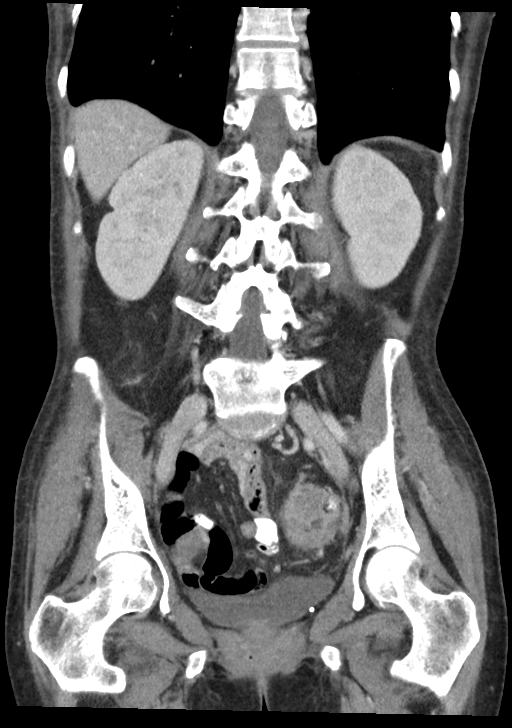

[14 of 46 positions shown; findings below may reference images not displayed]

FINDINGS: Lower chest: No acute abnormality.

Hepatobiliary: Geographic hypoattenuation in the left hemi-liver
adjacent to the fissure for the falciform ligament is nonspecific
but most suggestive of benign focal fatty infiltration. Normal
hepatic contour and morphology. No discrete hepatic lesions. Normal
appearance of the gallbladder. No intra or extrahepatic biliary
ductal dilatation.

Pancreas: Unremarkable. No pancreatic ductal dilatation or
surrounding inflammatory changes.

Spleen: Normal in size without focal abnormality.

Adrenals/Urinary Tract: Normal adrenal glands. No evidence of
hydronephrosis or nephrolithiasis. Small circumscribed subcentimeter
low-attenuation lesions in both kidneys are too small for accurate
characterization but statistically highly likely to represent benign
cysts. Unremarkable ureters and bladder.

Stomach/Bowel: The stomach and duodenum are unremarkable. Normal
appendix in the right lower quadrant. Progressive distension of the
distal transverse and descending colon by stool. Inflammatory
changes are noted in the pericolonic fat adjacent to the distal
descending colon in the left lower quadrant. The dilated segment
tapers abruptly to narrow in the sigmoid colon were there is a
moderate diverticular disease and apparent wall thickening
circumferentially. The remaining sigmoid colon and rectum are
decompressed.

Vascular/Lymphatic: No significant vascular findings are present. No
enlarged abdominal or pelvic lymph nodes.

Reproductive: Uterus and bilateral adnexa are unremarkable.

Other: No abdominal wall hernia or abnormality. No abdominopelvic
ascites.

Musculoskeletal: No acute fracture or aggressive appearing lytic or
blastic osseous lesion.
IMPRESSION: 1. Progressive stool-filled and distended distal transverse colon
and descending colon with abrupt tapering to normal caliber in a
region of sigmoid diverticulosis and circumferential wall
thickening. The imaging appearance is most suggestive of at least
partial colonic obstruction. The underlying obstructive lesion may
be secondary to an inflammatory stricture, or neoplasm. Of note, the
patient underwent colonoscopy yesterday and multiple biopsies were
obtained in this region. No evidence of perforation or abscess.
There is mild inflammatory strange in the pericolonic fat.
2. Geographic low-attenuation in the left hemi liver adjacent to the
fissure for the falciform ligament is nonspecific but most
suggestive of focal fatty infiltration. However, this focus has
enlarged slightly compared to prior imaging from 06/17/2017. If the
patient is ultimately diagnosed with a colonic neoplasm, metastatic
disease would be difficult to exclude radiographically and further
evaluation with MRI or PET-CT may become warranted.

## 2020-10-10 ENCOUNTER — Other Ambulatory Visit: Payer: Self-pay | Admitting: Family Medicine

## 2020-10-10 DIAGNOSIS — Z1231 Encounter for screening mammogram for malignant neoplasm of breast: Secondary | ICD-10-CM

## 2024-06-02 ENCOUNTER — Other Ambulatory Visit: Payer: Self-pay | Admitting: Student

## 2024-06-02 DIAGNOSIS — H90A22 Sensorineural hearing loss, unilateral, left ear, with restricted hearing on the contralateral side: Secondary | ICD-10-CM
